# Patient Record
Sex: Female | Born: 1995 | Race: Black or African American | Hispanic: No | Marital: Single | Smoking: Never smoker
Health system: Southern US, Community
[De-identification: ages and names within clinical notes are randomized; demographics above are authoritative.]

## PROBLEM LIST (undated history)

## (undated) ENCOUNTER — Inpatient Hospital Stay (HOSPITAL_COMMUNITY): Payer: Self-pay

## (undated) DIAGNOSIS — J45909 Unspecified asthma, uncomplicated: Secondary | ICD-10-CM

## (undated) HISTORY — PX: NO PAST SURGERIES: SHX2092

---
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
## 2015-02-19 ENCOUNTER — Emergency Department (INDEPENDENT_AMBULATORY_CARE_PROVIDER_SITE_OTHER)
Admission: EM | Admit: 2015-02-19 | Discharge: 2015-02-19 | Disposition: A | Discharge status: Home / Self Care | Payer: No Typology Code available for payment source | Source: Home / Self Care | Attending: Emergency Medicine | Admitting: Emergency Medicine

## 2015-02-19 ENCOUNTER — Encounter (HOSPITAL_COMMUNITY): Payer: Self-pay | Admitting: Emergency Medicine

## 2015-02-19 DIAGNOSIS — S90221A Contusion of right lesser toe(s) with damage to nail, initial encounter: Secondary | ICD-10-CM | POA: Diagnosis not present

## 2015-02-19 HISTORY — DX: Unspecified asthma, uncomplicated: J45.909

## 2015-02-19 NOTE — Discharge Instructions (Signed)
It looks like you had a bruise under your nail that got infected. The infection is gone at this time. Please do Epsom salt soaks 3 times a day to prevent recurrent infection. It will likely take 4-5 months for your toenail to go back to normal. If it becomes red and tender, please come back.

## 2015-02-19 NOTE — ED Provider Notes (Signed)
CSN: [id number]     Arrival date & time 02/19/15  1636 History   First MD Initiated Contact with Patient 02/19/15 1854     Chief Complaint  Patient presents with  . Nail Problem   (Consider location/radiation/quality/duration/timing/severity/associated sxs/prior Treatment) HPI  She is an 19 year old woman here with her mother for evaluation of right toenail problem. She states she hit it against a wall about a month ago. Initially it was quite tender. She does report bruising underneath the nail. She states the pain has since resolved. At one point it did drain some pus.  Past Medical History  Diagnosis Date  . Asthma    History reviewed. No pertinent past surgical history. No family history on file. History  Substance Use Topics  . Smoking status: Never Smoker   . Smokeless tobacco: Not on file  . Alcohol Use: No   OB History    No data available     Review of Systems As in history of present illness Allergies  Review of patient's allergies indicates no known allergies.  Home Medications   Prior to Admission medications   Not on File   BP 113/71 mmHg  Pulse 75  Temp(Src) 97.6 F (36.4 C) (Oral)  Resp 18  SpO2 100%  LMP 02/17/2015 Physical Exam  Constitutional: She is oriented to person, place, and time. She appears well-developed and well-nourished. No distress.  Cardiovascular: Normal rate.   Pulmonary/Chest: Effort normal.  Neurological: She is alert and oriented to person, place, and time.  Skin:  Right great toe: No erythema or tenderness. She does have a ruptured blister at the distal lateral nail edge. The lateral toenail is loose. No drainage expressed. I'm unable to visualize the nail due to nail polish.    ED Course  Procedures (including critical care time) Labs Review Labs Reviewed - No data to display  Imaging Review No results found.   MDM   1. Contusion of toenail, right, initial encounter    Symptomatic treatment with Epsom salts  soaks. No sign of infection at this time. Return precautions reviewed.    Charm Rings, MD 02/19/15 Ernestina [place]

## 2015-02-19 NOTE — ED Notes (Signed)
Reports nail issues involving both great toes.  Left toe is improving.  Right great toe is worse than left great toe.  Denies pain currently, but toe has hurt.  Patient does get professional pedicures

## 2015-07-19 NOTE — L&D Delivery Note (Addendum)
[name], Erica S Female, 19 y.o., 28-Apr-1996  Operative Delivery Note At 3:35 PM a viable female, "Saundra ShellingZaiden" was delivered via Vaginal, Spontaneous Delivery.  Presentation: vertex; Position: Occiput, anterior with subsequent head rotation to the maternal left, shoulder dystocia or babie's right shoulder.     Delivery of the head: 04/10/2016  3:35 PM First maneuver: 04/10/2016  3:35 PM, Mc[place]ts Second maneuver: 04/10/2016  3:35 PM, Suprapubic Pressure Delivery of baby within a few seoonds of shoulder dystocia being called, suprapubic pressure applied twice to effect delivery.   APGAR: 7, 9; weight 8 lb 9.9 oz (3910 g).   Placenta status: normal appearing, intact, to L & D.   Cord: normal.   Anesthesia:   Episiotomy: None Lacerations: 1st degree;Perineal Suture Repair: 3.0 vicryl Est. Blood Loss (mL): 500 Cytotec 1000 mcg placed per rectum for postpartum hemorrhage with improvement in bleeding.    Mom to postpartum.  Baby to Couplet care / Skin to Skin.  Baby for outpatient circumcision.   Konrad FelixKULWA,Manu Rubey WAKURU, MD.  04/10/2016, 5:24 PM

## 2015-08-18 ENCOUNTER — Emergency Department (HOSPITAL_COMMUNITY)
Admission: EM | Admit: 2015-08-18 | Discharge: 2015-08-18 | Disposition: A | Discharge status: Home / Self Care | Payer: Medicaid Other | Attending: Physician Assistant | Admitting: Physician Assistant

## 2015-08-18 ENCOUNTER — Encounter (HOSPITAL_COMMUNITY): Payer: Self-pay | Admitting: Emergency Medicine

## 2015-08-18 ENCOUNTER — Emergency Department (HOSPITAL_COMMUNITY): Payer: Medicaid Other

## 2015-08-18 DIAGNOSIS — O2341 Unspecified infection of urinary tract in pregnancy, first trimester: Secondary | ICD-10-CM | POA: Diagnosis not present

## 2015-08-18 DIAGNOSIS — O99511 Diseases of the respiratory system complicating pregnancy, first trimester: Secondary | ICD-10-CM | POA: Diagnosis not present

## 2015-08-18 DIAGNOSIS — Z79899 Other long term (current) drug therapy: Secondary | ICD-10-CM | POA: Insufficient documentation

## 2015-08-18 DIAGNOSIS — Z3A01 Less than 8 weeks gestation of pregnancy: Secondary | ICD-10-CM | POA: Diagnosis not present

## 2015-08-18 DIAGNOSIS — J45909 Unspecified asthma, uncomplicated: Secondary | ICD-10-CM | POA: Insufficient documentation

## 2015-08-18 DIAGNOSIS — N390 Urinary tract infection, site not specified: Secondary | ICD-10-CM

## 2015-08-18 DIAGNOSIS — Z3490 Encounter for supervision of normal pregnancy, unspecified, unspecified trimester: Secondary | ICD-10-CM

## 2015-08-18 DIAGNOSIS — R52 Pain, unspecified: Secondary | ICD-10-CM

## 2015-08-18 DIAGNOSIS — O9989 Other specified diseases and conditions complicating pregnancy, childbirth and the puerperium: Secondary | ICD-10-CM | POA: Diagnosis present

## 2015-08-18 LAB — URINALYSIS, ROUTINE W REFLEX MICROSCOPIC
BILIRUBIN URINE: NEGATIVE
Glucose, UA: NEGATIVE mg/dL
Hgb urine dipstick: NEGATIVE
KETONES UR: NEGATIVE mg/dL
NITRITE: POSITIVE — AB
Protein, ur: NEGATIVE mg/dL
Specific Gravity, Urine: 1.027 (ref 1.005–1.030)
pH: 6.5 (ref 5.0–8.0)

## 2015-08-18 LAB — CBC
HCT: 38.7 % (ref 36.0–46.0)
Hemoglobin: 12.9 g/dL (ref 12.0–15.0)
MCH: 28.8 pg (ref 26.0–34.0)
MCHC: 33.3 g/dL (ref 30.0–36.0)
MCV: 86.4 fL (ref 78.0–100.0)
PLATELETS: 202 10*3/uL (ref 150–400)
RBC: 4.48 MIL/uL (ref 3.87–5.11)
RDW: 13.3 % (ref 11.5–15.5)
WBC: 5 10*3/uL (ref 4.0–10.5)

## 2015-08-18 LAB — HCG, QUANTITATIVE, PREGNANCY: hCG, Beta Chain, Quant, S: 43832 m[IU]/mL — ABNORMAL HIGH (ref <5)

## 2015-08-18 LAB — URINE MICROSCOPIC-ADD ON

## 2015-08-18 LAB — COMPREHENSIVE METABOLIC PANEL
ALBUMIN: 4 g/dL (ref 3.5–5.0)
ALK PHOS: 45 U/L (ref 38–126)
ALT: 14 U/L (ref 14–54)
ANION GAP: 10 (ref 5–15)
AST: 16 U/L (ref 15–41)
BILIRUBIN TOTAL: 0.9 mg/dL (ref 0.3–1.2)
BUN: 9 mg/dL (ref 6–20)
CALCIUM: 9.3 mg/dL (ref 8.9–10.3)
CO2: 25 mmol/L (ref 22–32)
Chloride: 102 mmol/L (ref 101–111)
Creatinine, Ser: 0.75 mg/dL (ref 0.44–1.00)
GFR calc Af Amer: >60 mL/min (ref >60)
GFR calc non Af Amer: >60 mL/min (ref >60)
GLUCOSE: 106 mg/dL — AB (ref 65–99)
Potassium: 3.4 mmol/L — ABNORMAL LOW (ref 3.5–5.1)
Sodium: 137 mmol/L (ref 135–145)
TOTAL PROTEIN: 7.7 g/dL (ref 6.5–8.1)

## 2015-08-18 LAB — WET PREP, GENITAL
Sperm: NONE SEEN
Trich, Wet Prep: NONE SEEN
Yeast Wet Prep HPF POC: NONE SEEN

## 2015-08-18 LAB — I-STAT BETA HCG BLOOD, ED (MC, WL, AP ONLY)

## 2015-08-18 MED ORDER — NITROFURANTOIN MONOHYD MACRO 100 MG PO CAPS
100.0000 mg | ORAL_CAPSULE | Freq: Once | ORAL | Status: AC
  Administered 2015-08-18: 100 mg via ORAL
  Filled 2015-08-18: qty 1

## 2015-08-18 MED ORDER — NITROFURANTOIN MONOHYD MACRO 100 MG PO CAPS: 100.0000 mg | ORAL_CAPSULE | Freq: Two times a day (BID) | ORAL | Status: DC

## 2015-08-18 NOTE — Progress Notes (Signed)
Entered in d/c instructions Associates, [facility] Schedule an appointment as soon as possible for a visit pcp per [facility] access is [facility] [address] [place], [place] [address] [phone]   Associates, [facility] This is your assigned Medicaid [place] access doctor If you prefer another contact DSS [phone] DSS assigned your doctor *You may receive a bill if you go to any family Dr not assigned to you   Medicaid [place] Access Covered Patient [place]: [phone] [address] [place], [place] [address] [email or web address] USE THIS WEBSITE TO ASSIST WITH UNDERSTANDING YOUR COVERAGE, RENEW APPLICATION As a Medicaid client you MUST contact DSS/SSI each time you change address, move to another [place] or another state to keep your address updated [place] Medicaid Transportation to Dr appts if you are have full Medicaid: [phone], [phone]

## 2015-08-18 NOTE — ED Notes (Signed)
Pt transported to US

## 2015-08-18 NOTE — ED Notes (Signed)
Patient has 2 extra GOLD tubes in main lab.

## 2015-08-18 NOTE — Progress Notes (Signed)
Pt medicaid [place] access response hx lists her pcp as [facility] [address] [place], [place] [address] [phone] Entered in [place]

## 2015-08-18 NOTE — ED Notes (Signed)
Patient transported to Ultrasound 

## 2015-08-18 NOTE — ED Provider Notes (Addendum)
CSN: [id number]     Arrival date & time 08/18/15  4098 History   First MD Initiated Contact with Patient 08/18/15 1051     Chief Complaint  Patient presents with  . Abdominal Pain     (Consider location/radiation/quality/duration/timing/severity/associated sxs/prior Treatment) HPI   Patient's very pleasant 20 year old female presenting with abdominal pain and back pain. She took positive pregnancy test at home 3. Patient here concerned about pregnancy. Patient denies any vaginal bleeding. Does endorse increased vaginal secretions.  Patient is G0 P1. This is an unintended pregnancy.  Past Medical History  Diagnosis Date  . Asthma    History reviewed. No pertinent past surgical history. No family history on file. Social History  Substance Use Topics  . Smoking status: Never Smoker   . Smokeless tobacco: None  . Alcohol Use: No   OB History    Gravida Para Term Preterm AB TAB SAB Ectopic Multiple Living   1              Review of Systems  Constitutional: Negative for activity change.  Respiratory: Negative for shortness of breath.   Cardiovascular: Negative for chest pain.  Gastrointestinal: Positive for abdominal pain.  Genitourinary: Positive for vaginal discharge. Negative for dysuria, vaginal bleeding and genital sores.  Musculoskeletal: Positive for back pain.  Psychiatric/Behavioral: Negative for agitation.  All other systems reviewed and are negative.     Allergies  Review of patient's allergies indicates no known allergies.  Home Medications   Prior to Admission medications   Medication Sig Start Date End Date Taking? Authorizing Provider  albuterol (PROVENTIL HFA;VENTOLIN HFA) 108 (90 Base) MCG/ACT inhaler Inhale 2 puffs into the lungs every 6 (six) hours as needed for wheezing or shortness of breath.   Yes Historical Provider, MD  nitrofurantoin, macrocrystal-monohydrate, (MACROBID) 100 MG capsule Take 1 capsule (100 mg total) by mouth 2 (two) times  daily. 08/18/15   Courteney Lyn Mackuen, MD   BP 112/70 mmHg  Pulse 76  Temp(Src) 97.8 F (36.6 C) (Oral)  Resp 14  SpO2 100%  LMP 06/17/2015 Physical Exam  Constitutional: She is oriented to person, place, and time. She appears well-developed and well-nourished.  HENT:  Head: Normocephalic and atraumatic.  Eyes: Conjunctivae are normal. Right eye exhibits no discharge.  Neck: Neck supple.  Cardiovascular: Normal rate, regular rhythm and normal heart sounds.   No murmur heard. Pulmonary/Chest: Effort normal and breath sounds normal. She has no wheezes. She has no rales.  Abdominal: Soft. She exhibits no distension. There is no tenderness.  Genitourinary:  No CMT. Normal vaginal mucosa. Increased vaginal discharge, white.  Musculoskeletal: Normal range of motion. She exhibits no edema.  Neurological: She is oriented to person, place, and time. No cranial nerve deficit.  Skin: Skin is warm and dry. No rash noted. She is not diaphoretic.  Psychiatric: She has a normal mood and affect. Her behavior is normal.  Nursing note and vitals reviewed.   ED Course  Procedures (including critical care time) Labs Review Labs Reviewed  WET PREP, GENITAL - Abnormal; Notable for the following:    Clue Cells Wet Prep HPF POC PRESENT (*)    WBC, Wet Prep HPF POC FEW (*)    All other components within normal limits  COMPREHENSIVE METABOLIC PANEL - Abnormal; Notable for the following:    Potassium 3.4 (*)    Glucose, Bld 106 (*)    All other components within normal limits  URINALYSIS, ROUTINE W REFLEX MICROSCOPIC (NOT AT [facility]) -  Abnormal; Notable for the following:    APPearance CLOUDY (*)    Nitrite POSITIVE (*)    Leukocytes, UA SMALL (*)    All other components within normal limits  HCG, QUANTITATIVE, PREGNANCY - Abnormal; Notable for the following:    hCG, Beta Chain, Quant, [place] [address] (*)    All other components within normal limits  URINE MICROSCOPIC-ADD ON - Abnormal; Notable for the  following:    Squamous Epithelial / LPF 0-5 (*)    Bacteria, UA MANY (*)    All other components within normal limits  I-STAT BETA HCG BLOOD, ED ([facility], [facility], [facility] ONLY) - Abnormal; Notable for the following:    I-stat hCG, quantitative >2000.0 (*)    All other components within normal limits  CBC  HIV ANTIBODY (ROUTINE TESTING)  RPR  GC/CHLAMYDIA PROBE AMP ([place]) NOT AT [facility]    Imaging Review US Ob Transvaginal  08/18/2015  CLINICAL DATA:  Pelvic pain for 11 days EXAM: OBSTETRIC <14 WK [place] AND TRANSVAGINAL OB US TECHNIQUE: Both transabdominal and transvaginal ultrasound examinations were performed for complete evaluation of the gestation as well as the maternal uterus, adnexal regions, and pelvic cul-de-sac. Transvaginal technique was performed to assess early pregnancy. COMPARISON:  None. FINDINGS: Intrauterine gestational sac: Visualized/normal in shape. Yolk sac:  Visualized Embryo:  Visualized Cardiac Activity: Visualize Heart Rate: 121  bpm MSD:   mm    w     d CRL:  9  mm   6 w   6 d                  [place] EDC: 04/06/2016 Subchorionic hemorrhage:  Small subchorionic hemorrhage. Maternal uterus/adnexae: No adnexal masses. Trace free fluid in the pelvis. IMPRESSION: Six week 6 day intrauterine pregnancy. Fetal heart rate 121 beats per minute. Small subchorionic hemorrhage. Electronically Signed   By: Charlett Nose M.D.   On: 08/18/2015 14:51   US Pelvis Complete  08/18/2015  CLINICAL DATA:  Pelvic pain for 11 days EXAM: OBSTETRIC <14 WK [place] AND TRANSVAGINAL OB US TECHNIQUE: Both transabdominal and transvaginal ultrasound examinations were performed for complete evaluation of the gestation as well as the maternal uterus, adnexal regions, and pelvic cul-de-sac. Transvaginal technique was performed to assess early pregnancy. COMPARISON:  None. FINDINGS: Intrauterine gestational sac: Visualized/normal in shape. Yolk sac:  Visualized Embryo:  Visualized Cardiac Activity: Visualize Heart Rate: 121  bpm  MSD:   mm    w     d CRL:  9  mm   6 w   6 d                  [place] EDC: 04/06/2016 Subchorionic hemorrhage:  Small subchorionic hemorrhage. Maternal uterus/adnexae: No adnexal masses. Trace free fluid in the pelvis. IMPRESSION: Six week 6 day intrauterine pregnancy. Fetal heart rate 121 beats per minute. Small subchorionic hemorrhage. Electronically Signed   By: Charlett Nose M.D.   On: 08/18/2015 14:51   I have personally reviewed and evaluated these images and lab results as part of my medical decision-making.   EKG Interpretation None      MDM   Final diagnoses:  Pregnancy  UTI (lower urinary tract infection)    Patient is very pleasant G0 P1 presenting with new pregnancy. Patient has had some abdominal pain and some back pain. Given the pain and new pregnancy we will do TV ultrasound to locate pregnancy. We had long discussion with patient about options including [facility].  We'll do vaginal exam to make sure the patient has no frank infection.  No bleeding so no need rhogam this time.  3:33 PM US shows 6.6 week fetus. Also UTI present. Will treat for UTI and patietn given a lot of follow up options.   Courteney Randall An, MD 08/18/15 1533  Courteney Randall An, MD 08/18/15 1533

## 2015-08-18 NOTE — Discharge Instructions (Signed)
You were seen today with back pain. We found that you have a new pregnancy. Also have a urinary tract infection. We discussed options for this pregnancy. Please follow-up with the primary care physician or [facility] with the following information.  [facility] Address: [address], [place], [place] [address] [phone]

## 2015-08-18 NOTE — ED Notes (Addendum)
Per pt, states abdominal and back pain since Saturday-no dysuria-states she took 3 pregnancy tests and hey were all postive

## 2015-08-19 LAB — GC/CHLAMYDIA PROBE AMP (~~LOC~~) NOT AT ARMC
Chlamydia: NEGATIVE
Neisseria Gonorrhea: NEGATIVE

## 2015-08-19 LAB — HIV ANTIBODY (ROUTINE TESTING W REFLEX): HIV Screen 4th Generation wRfx: NONREACTIVE

## 2015-08-19 LAB — RPR: RPR Ser Ql: NONREACTIVE

## 2015-09-24 LAB — OB RESULTS CONSOLE GC/CHLAMYDIA: GC PROBE AMP, GENITAL: NEGATIVE

## 2015-09-24 LAB — OB RESULTS CONSOLE HEPATITIS B SURFACE ANTIGEN: HEP B S AG: NEGATIVE

## 2015-10-16 ENCOUNTER — Encounter (HOSPITAL_COMMUNITY): Payer: Self-pay | Admitting: *Deleted

## 2015-10-16 ENCOUNTER — Inpatient Hospital Stay (HOSPITAL_COMMUNITY)
Admission: AD | Admit: 2015-10-16 | Discharge: 2015-10-16 | Disposition: A | Discharge status: Home / Self Care | Payer: Medicaid Other | Source: Ambulatory Visit | Attending: Obstetrics and Gynecology | Admitting: Obstetrics and Gynecology

## 2015-10-16 DIAGNOSIS — O99512 Diseases of the respiratory system complicating pregnancy, second trimester: Secondary | ICD-10-CM | POA: Diagnosis not present

## 2015-10-16 DIAGNOSIS — O26892 Other specified pregnancy related conditions, second trimester: Secondary | ICD-10-CM | POA: Insufficient documentation

## 2015-10-16 DIAGNOSIS — R1031 Right lower quadrant pain: Secondary | ICD-10-CM | POA: Diagnosis present

## 2015-10-16 DIAGNOSIS — Z3A15 15 weeks gestation of pregnancy: Secondary | ICD-10-CM | POA: Diagnosis not present

## 2015-10-16 DIAGNOSIS — O26852 Spotting complicating pregnancy, second trimester: Secondary | ICD-10-CM | POA: Diagnosis present

## 2015-10-16 DIAGNOSIS — J45909 Unspecified asthma, uncomplicated: Secondary | ICD-10-CM | POA: Diagnosis not present

## 2015-10-16 LAB — URINALYSIS, ROUTINE W REFLEX MICROSCOPIC
Bilirubin Urine: NEGATIVE
Glucose, UA: NEGATIVE mg/dL
HGB URINE DIPSTICK: NEGATIVE
KETONES UR: 15 mg/dL — AB
Nitrite: NEGATIVE
PROTEIN: NEGATIVE mg/dL
Specific Gravity, Urine: 1.02 (ref 1.005–1.030)
pH: 6.5 (ref 5.0–8.0)

## 2015-10-16 LAB — URINE MICROSCOPIC-ADD ON: RBC / HPF: NONE SEEN RBC/hpf (ref 0–5)

## 2015-10-16 MED ORDER — IBUPROFEN 100 MG/5ML PO SUSP: 600.0000 mg | Freq: Four times a day (QID) | ORAL | Status: DC | PRN

## 2015-10-16 MED ORDER — IBUPROFEN 100 MG/5ML PO SUSP
600.0000 mg | ORAL | Status: AC
  Administered 2015-10-16: 600 mg via ORAL
  Filled 2015-10-16: qty 30

## 2015-10-16 NOTE — MAU Provider Note (Signed)
  History   20 yo G1P0 at 2315 2/7 weeks presented unannounced c/o right lower abdominal pain into groin, extending into upper thigh.  Denies leaking, bleeding, dysuria, fever, trauma, or any other issues. Reports pain in sharp, more present when lying completely flat or turning.  Seen in office this week for spotting, had been dx with BV previously, but was not taking medication correctly.    Patient Active Problem List   Diagnosis Date Noted  . Asthma 10/16/2015  . Spotting affecting pregnancy in second trimester 10/16/2015    Chief Complaint  Patient presents with  . Abdominal Cramping   HPI: As above  OB History    Gravida Para Term Preterm AB TAB SAB Ectopic Multiple Living   1               Past Medical History  Diagnosis Date  . Asthma     No past surgical history on file.  No family history on file.  Social History  Substance Use Topics  . Smoking status: Never Smoker   . Smokeless tobacco: Not on file  . Alcohol Use: No    Allergies: No Known Allergies  No prescriptions prior to admission    ROS:  RLQ/groin pain Physical Exam   Blood pressure 114/59, pulse 91, temperature 98.3 F (36.8 C), temperature source Oral, resp. rate 16, height 5' 2.99" (1.6 m), weight 77.111 kg (170 lb), last menstrual period 06/17/2015, SpO2 100 %.    Physical Exam  In NAD Chest clear Heart RRR without murmur Abd gravid, NT, fundal height 16 weeks, no rebound or guarding. Pelvic--no d/c, cervix closed, uterus NT Ext WNL  FHR 154  Results for orders placed or performed during the hospital encounter of 10/16/15 (from the past 24 hour(s))  Urinalysis, Routine w reflex microscopic (not at [facility])     Status: Abnormal   Collection Time: 10/16/15 11:45 AM  Result Value Ref Range   Color, Urine YELLOW YELLOW   APPearance HAZY (A) CLEAR   Specific Gravity, Urine 1.020 1.005 - 1.030   pH 6.5 5.0 - 8.0   Glucose, UA NEGATIVE NEGATIVE mg/dL   Hgb urine dipstick NEGATIVE  NEGATIVE   Bilirubin Urine NEGATIVE NEGATIVE   Ketones, ur 15 (A) NEGATIVE mg/dL   Protein, ur NEGATIVE NEGATIVE mg/dL   Nitrite NEGATIVE NEGATIVE   Leukocytes, UA TRACE (A) NEGATIVE  Urine microscopic-add on     Status: Abnormal   Collection Time: 10/16/15 11:45 AM  Result Value Ref Range   Squamous Epithelial / LPF 0-5 (A) NONE SEEN   WBC, UA 0-5 0 - 5 WBC/hpf   RBC / HPF NONE SEEN 0 - 5 RBC/hpf   Bacteria, UA RARE (A) NONE SEEN   Urine-Other MUCOUS PRESENT     ED Course  Assessment: IUP at 15 2/7 weeks Round ligament pain  Plan: D/C home Rx Ibuprophen 600 mg po q 6 hours x 24-48 hours, then prn (liquid form, due to patient preference). Comfort measures for round ligament pain. [facility] for anatomy US, call prn.  Nigel BridgemanLATHAM, Ladarrius Bogdanski CNM, MSN 10/16/2015 2:49 PM

## 2015-10-16 NOTE — Discharge Instructions (Signed)
TAKE IBUPROPHEN EVERY 6 HOURS FOR NEXT 2 DAYS, THEN AS NEEDED.  Round Ligament Pain The round ligament is a cord of muscle and tissue that helps to support the uterus. It can become a source of pain during pregnancy if it becomes stretched or twisted as the baby grows. The pain usually begins in the second trimester of pregnancy, and it can come and go until the baby is delivered. It is not a serious problem, and it does not cause harm to the baby. Round ligament pain is usually a short, sharp, and pinching pain, but it can also be a dull, lingering, and aching pain. The pain is felt in the lower side of the abdomen or in the groin. It usually starts deep in the groin and moves up to the outside of the hip area. Pain can occur with:  A sudden change in position.  Rolling over in bed.  Coughing or sneezing.  Physical activity. HOME CARE INSTRUCTIONS Watch your condition for any changes. Take these steps to help with your pain:  When the pain starts, relax. Then try:  Sitting down.  Flexing your knees up to your abdomen.  Lying on your side with one pillow under your abdomen and another pillow between your legs.  Sitting in a warm bath for 15-20 minutes or until the pain goes away.  Take over-the-counter and prescription medicines only as told by your health care provider.  Move slowly when you sit and stand.  Avoid long walks if they cause pain.  Stop or lessen your physical activities if they cause pain. SEEK MEDICAL CARE IF:  Your pain does not go away with treatment.  You feel pain in your back that you did not have before.  Your medicine is not helping. SEEK IMMEDIATE MEDICAL CARE IF:  You develop a fever or chills.  You develop uterine contractions.  You develop vaginal bleeding.  You develop nausea or vomiting.  You develop diarrhea.  You have pain when you urinate.   This information is not intended to replace advice given to you by your health care  provider. Make sure you discuss any questions you have with your health care provider.   Document Released: 04/12/2008 Document Revised: 09/26/2011 Document Reviewed: 09/10/2014 Elsevier Interactive Patient Education [employer]2016 Elsevier Inc.

## 2015-10-16 NOTE — MAU Note (Signed)
Pt presents to MAU with complaints of lower abdominal cramping that stated yesterday. Pt reports scant amount of vaginal bleeding when she wipes.

## 2016-02-12 ENCOUNTER — Inpatient Hospital Stay (HOSPITAL_COMMUNITY)
Admission: AD | Admit: 2016-02-12 | Discharge: 2016-02-12 | Disposition: A | Discharge status: Home / Self Care | Payer: Medicaid Other | Source: Ambulatory Visit | Attending: Obstetrics & Gynecology | Admitting: Obstetrics & Gynecology

## 2016-02-12 ENCOUNTER — Encounter (HOSPITAL_COMMUNITY): Payer: Self-pay | Admitting: *Deleted

## 2016-02-12 DIAGNOSIS — O99513 Diseases of the respiratory system complicating pregnancy, third trimester: Secondary | ICD-10-CM | POA: Insufficient documentation

## 2016-02-12 DIAGNOSIS — O26893 Other specified pregnancy related conditions, third trimester: Secondary | ICD-10-CM | POA: Diagnosis present

## 2016-02-12 DIAGNOSIS — R109 Unspecified abdominal pain: Secondary | ICD-10-CM | POA: Diagnosis present

## 2016-02-12 DIAGNOSIS — Z3A32 32 weeks gestation of pregnancy: Secondary | ICD-10-CM | POA: Insufficient documentation

## 2016-02-12 DIAGNOSIS — N949 Unspecified condition associated with female genital organs and menstrual cycle: Secondary | ICD-10-CM | POA: Diagnosis not present

## 2016-02-12 DIAGNOSIS — O9989 Other specified diseases and conditions complicating pregnancy, childbirth and the puerperium: Secondary | ICD-10-CM

## 2016-02-12 DIAGNOSIS — J45909 Unspecified asthma, uncomplicated: Secondary | ICD-10-CM | POA: Insufficient documentation

## 2016-02-12 LAB — URINE MICROSCOPIC-ADD ON

## 2016-02-12 LAB — URINALYSIS, ROUTINE W REFLEX MICROSCOPIC
Bilirubin Urine: NEGATIVE
GLUCOSE, UA: NEGATIVE mg/dL
HGB URINE DIPSTICK: NEGATIVE
Ketones, ur: NEGATIVE mg/dL
Nitrite: NEGATIVE
PH: 8 (ref 5.0–8.0)
PROTEIN: NEGATIVE mg/dL
Specific Gravity, Urine: 1.02 (ref 1.005–1.030)

## 2016-02-12 NOTE — MAU Provider Note (Signed)
History     CSN: [id number]  Arrival date and time: 02/12/16 2125   First Provider Initiated Contact with Patient 02/12/16 2215      Chief Complaint  Patient presents with  . Abdominal Cramping   HPI Ms. Erica Barber is a 20 y.o. G1P0 at [redacted]w[redacted]d who presents to MAU today with complaint of lower abdominal cramping. The patient states that pain started around 1300 today. She initially states pain has been constant, but then states no pain currently as it is only with movement. She states pain resolves while at rest. She describes pain as cramping in the lower to mid abdomen. She denies vaginal bleeding, LOF, UTI symptoms, contractions or recent N/V/D. She states no known complications with this pregnancy and reports good fetal movement.   OB History    Gravida Para Term Preterm AB Living   1             SAB TAB Ectopic Multiple Live Births                  Past Medical History:  Diagnosis Date  . Asthma     History reviewed. No pertinent surgical history.  No family history on file.  Social History  Substance Use Topics  . Smoking status: Never Smoker  . Smokeless tobacco: Not on file  . Alcohol use No    Allergies: No Known Allergies  Prescriptions Prior to Admission  Medication Sig Dispense Refill Last Dose  . Prenatal Vit-Fe Fumarate-FA (PRENATAL MULTIVITAMIN) TABS tablet Take 3 tablets by mouth at bedtime.    02/11/2016 at Unknown time  . ibuprofen (ADVIL,MOTRIN) 100 MG/5ML suspension Take 30 mLs (600 mg total) by mouth every 6 (six) hours as needed. (Patient not taking: Reported on 02/12/2016) 600 mL 1 Not Taking at Unknown time    Review of Systems  Constitutional: Negative for fever and malaise/fatigue.  Gastrointestinal: Positive for abdominal pain. Negative for constipation, diarrhea, nausea and vomiting.  Genitourinary: Negative for dysuria, frequency and urgency.       Neg - vaginal bleeding, discharge, LOF   Physical Exam   Blood pressure 116/66,  pulse 96, temperature 98.7 F (37.1 C), resp. rate 18, height 5' 2.5" (1.588 m), weight 191 lb 9.6 oz (86.9 kg), last menstrual period 06/17/2015.  Physical Exam  Nursing note and vitals reviewed. Constitutional: She is oriented to person, place, and time. She appears well-developed and well-nourished. No distress.  HENT:  Head: Normocephalic and atraumatic.  Cardiovascular: Normal rate.   Respiratory: Effort normal.  GI: Soft. She exhibits no distension and no mass. There is no tenderness. There is no rebound and no guarding.  Neurological: She is alert and oriented to person, place, and time.  Skin: Skin is warm and dry. No erythema.  Psychiatric: She has a normal mood and affect.   Dilation: Closed Effacement (%): Thick Cervical Position: Posterior Exam by:: Harlon Flor  Results for orders placed or performed during the hospital encounter of 02/12/16 (from the past 24 hour(s))  Urinalysis, Routine w reflex microscopic (not at [facility])     Status: Abnormal   Collection Time: 02/12/16  9:47 PM  Result Value Ref Range   Color, Urine YELLOW YELLOW   APPearance CLEAR CLEAR   Specific Gravity, Urine 1.020 1.005 - 1.030   pH 8.0 5.0 - 8.0   Glucose, UA NEGATIVE NEGATIVE mg/dL   Hgb urine dipstick NEGATIVE NEGATIVE   Bilirubin Urine NEGATIVE NEGATIVE   Ketones, ur NEGATIVE NEGATIVE mg/dL  Protein, ur NEGATIVE NEGATIVE mg/dL   Nitrite NEGATIVE NEGATIVE   Leukocytes, UA SMALL (A) NEGATIVE  Urine microscopic-add on     Status: Abnormal   Collection Time: 02/12/16  9:47 PM  Result Value Ref Range   Squamous Epithelial / LPF 6-30 (A) NONE SEEN   WBC, UA 0-5 0 - 5 WBC/hpf   RBC / HPF 0-5 0 - 5 RBC/hpf   Bacteria, UA FEW (A) NONE SEEN    MAU Course  Procedures None  MDM UA today without evidence of dehydration or infection FFN collected prior to SVE. Cervix is closed, FFN discarded.  Discussed patient with Dr. [name], agrees with plan for discharge at this time with preterm labor  precautions.  Assessment and Plan  A: SIUP at [redacted]w[redacted]d Round ligament pain  P: Discharge home Tylenol PRN for pain  Preterm labor precautions discussed Patient advised to increased PO hydration as tolerated Discussed use of abdominal binder, warm bath/shower and moderation of activity for pain Patient advised to follow-up with [facility] as scheduled for routine prenatal care or sooner PRN Patient may return to MAU as needed or if her condition were to change or worsen   Marny Lowenstein, PA-C  02/12/2016, 10:15 PM

## 2016-02-12 NOTE — MAU Note (Signed)
Pt reports abdominal cramping which stated around 1pm today. She noticed the cramping when she was trying to sit up and when she started to walk. Denies vaginal bleeding or discharge.

## 2016-02-12 NOTE — Discharge Instructions (Signed)

## 2016-02-12 NOTE — MAU Note (Signed)
Cramping on R side of lower abd today. Worse when I would try to get up. Does not hurt lying down. Denies LOF or bleeding.

## 2016-03-08 LAB — OB RESULTS CONSOLE GBS: STREP GROUP B AG: POSITIVE

## 2016-04-09 ENCOUNTER — Inpatient Hospital Stay (HOSPITAL_COMMUNITY)
Admission: AD | Admit: 2016-04-09 | Discharge: 2016-04-12 | DRG: 774 | Disposition: A | Discharge status: Home / Self Care | Payer: Medicaid Other | Source: Ambulatory Visit | Attending: Obstetrics & Gynecology | Admitting: Obstetrics & Gynecology

## 2016-04-09 ENCOUNTER — Encounter (HOSPITAL_COMMUNITY): Payer: Self-pay | Admitting: *Deleted

## 2016-04-09 DIAGNOSIS — O9902 Anemia complicating childbirth: Secondary | ICD-10-CM | POA: Diagnosis present

## 2016-04-09 DIAGNOSIS — B951 Streptococcus, group B, as the cause of diseases classified elsewhere: Secondary | ICD-10-CM

## 2016-04-09 DIAGNOSIS — Z3A40 40 weeks gestation of pregnancy: Secondary | ICD-10-CM | POA: Diagnosis not present

## 2016-04-09 DIAGNOSIS — O4202 Full-term premature rupture of membranes, onset of labor within 24 hours of rupture: Principal | ICD-10-CM | POA: Diagnosis present

## 2016-04-09 DIAGNOSIS — O99824 Streptococcus B carrier state complicating childbirth: Secondary | ICD-10-CM | POA: Diagnosis present

## 2016-04-09 DIAGNOSIS — D649 Anemia, unspecified: Secondary | ICD-10-CM | POA: Diagnosis present

## 2016-04-09 DIAGNOSIS — O9952 Diseases of the respiratory system complicating childbirth: Secondary | ICD-10-CM | POA: Diagnosis present

## 2016-04-09 DIAGNOSIS — O4292 Full-term premature rupture of membranes, unspecified as to length of time between rupture and onset of labor: Secondary | ICD-10-CM

## 2016-04-09 DIAGNOSIS — IMO0001 Reserved for inherently not codable concepts without codable children: Secondary | ICD-10-CM

## 2016-04-09 DIAGNOSIS — J45909 Unspecified asthma, uncomplicated: Secondary | ICD-10-CM | POA: Diagnosis present

## 2016-04-09 LAB — URINE MICROSCOPIC-ADD ON

## 2016-04-09 LAB — RPR: RPR: NONREACTIVE

## 2016-04-09 LAB — CBC
HCT: 34.1 % — ABNORMAL LOW (ref 36.0–46.0)
HEMOGLOBIN: 11.4 g/dL — AB (ref 12.0–15.0)
MCH: 28.3 pg (ref 26.0–34.0)
MCHC: 33.4 g/dL (ref 30.0–36.0)
MCV: 84.6 fL (ref 78.0–100.0)
PLATELETS: 179 10*3/uL (ref 150–400)
RBC: 4.03 MIL/uL (ref 3.87–5.11)
RDW: 15 % (ref 11.5–15.5)
WBC: 8.1 10*3/uL (ref 4.0–10.5)

## 2016-04-09 LAB — URINALYSIS, ROUTINE W REFLEX MICROSCOPIC
BILIRUBIN URINE: NEGATIVE
Glucose, UA: NEGATIVE mg/dL
Ketones, ur: NEGATIVE mg/dL
Nitrite: NEGATIVE
PH: 7 (ref 5.0–8.0)
Protein, ur: NEGATIVE mg/dL
SPECIFIC GRAVITY, URINE: 1.01 (ref 1.005–1.030)

## 2016-04-09 LAB — TYPE AND SCREEN
ABO/RH(D): O POS
ANTIBODY SCREEN: NEGATIVE

## 2016-04-09 LAB — ABO/RH: ABO/RH(D): O POS

## 2016-04-09 LAB — POCT FERN TEST: POCT FERN TEST: POSITIVE

## 2016-04-09 MED ORDER — PENICILLIN G POTASSIUM 5000000 UNITS IJ SOLR
2.5000 10*6.[IU] | INTRAVENOUS | Status: DC
  Administered 2016-04-09 – 2016-04-10 (×6): 2.5 10*6.[IU] via INTRAVENOUS
  Filled 2016-04-09 (×13): qty 2.5

## 2016-04-09 MED ORDER — EPHEDRINE 5 MG/ML INJ
10.0000 mg | INTRAVENOUS | Status: DC | PRN
  Administered 2016-04-10: 15 mg via INTRAVENOUS
  Filled 2016-04-09: qty 4

## 2016-04-09 MED ORDER — LIDOCAINE HCL (PF) 1 % IJ SOLN
30.0000 mL | INTRAMUSCULAR | Status: DC | PRN
  Filled 2016-04-09: qty 30

## 2016-04-09 MED ORDER — FLEET ENEMA 7-19 GM/118ML RE ENEM: 1.0000 | ENEMA | RECTAL | Status: DC | PRN

## 2016-04-09 MED ORDER — ONDANSETRON HCL 4 MG/2ML IJ SOLN
4.0000 mg | Freq: Four times a day (QID) | INTRAMUSCULAR | Status: DC | PRN
  Administered 2016-04-09: 4 mg via INTRAVENOUS
  Filled 2016-04-09: qty 2

## 2016-04-09 MED ORDER — OXYCODONE-ACETAMINOPHEN 5-325 MG PO TABS: 1.0000 | ORAL_TABLET | ORAL | Status: DC | PRN

## 2016-04-09 MED ORDER — OXYTOCIN 40 UNITS IN LACTATED RINGERS INFUSION - SIMPLE MED: 2.5000 [IU]/h | INTRAVENOUS | Status: DC

## 2016-04-09 MED ORDER — OXYTOCIN 40 UNITS IN LACTATED RINGERS INFUSION - SIMPLE MED
1.0000 m[IU]/min | INTRAVENOUS | Status: DC
  Administered 2016-04-09: 1 m[IU]/min via INTRAVENOUS
  Filled 2016-04-09: qty 1000

## 2016-04-09 MED ORDER — PHENYLEPHRINE 40 MCG/ML (10ML) SYRINGE FOR IV PUSH (FOR BLOOD PRESSURE SUPPORT)
80.0000 ug | PREFILLED_SYRINGE | INTRAVENOUS | Status: DC | PRN
  Filled 2016-04-09: qty 5
  Filled 2016-04-09: qty 10

## 2016-04-09 MED ORDER — EPHEDRINE 5 MG/ML INJ
10.0000 mg | INTRAVENOUS | Status: DC | PRN
  Filled 2016-04-09 (×2): qty 4

## 2016-04-09 MED ORDER — TERBUTALINE SULFATE 1 MG/ML IJ SOLN
0.2500 mg | Freq: Once | INTRAMUSCULAR | Status: DC | PRN
  Filled 2016-04-09: qty 1

## 2016-04-09 MED ORDER — ACETAMINOPHEN 325 MG PO TABS: 650.0000 mg | ORAL_TABLET | ORAL | Status: DC | PRN

## 2016-04-09 MED ORDER — FENTANYL 2.5 MCG/ML BUPIVACAINE 1/10 % EPIDURAL INFUSION (WH - ANES)
14.0000 mL/h | INTRAMUSCULAR | Status: DC | PRN
  Administered 2016-04-10 (×2): 14 mL/h via EPIDURAL
  Filled 2016-04-09 (×2): qty 125

## 2016-04-09 MED ORDER — OXYCODONE-ACETAMINOPHEN 5-325 MG PO TABS: 2.0000 | ORAL_TABLET | ORAL | Status: DC | PRN

## 2016-04-09 MED ORDER — OXYTOCIN BOLUS FROM INFUSION: 500.0000 mL | Freq: Once | INTRAVENOUS | Status: DC

## 2016-04-09 MED ORDER — PHENYLEPHRINE 40 MCG/ML (10ML) SYRINGE FOR IV PUSH (FOR BLOOD PRESSURE SUPPORT)
80.0000 ug | PREFILLED_SYRINGE | INTRAVENOUS | Status: DC | PRN
  Administered 2016-04-10: 80 ug via INTRAVENOUS
  Filled 2016-04-09: qty 5

## 2016-04-09 MED ORDER — SOD CITRATE-CITRIC ACID 500-334 MG/5ML PO SOLN: 30.0000 mL | ORAL | Status: DC | PRN

## 2016-04-09 MED ORDER — LACTATED RINGERS IV SOLN
INTRAVENOUS | Status: DC
  Administered 2016-04-09 – 2016-04-10 (×3): via INTRAVENOUS

## 2016-04-09 MED ORDER — DIPHENHYDRAMINE HCL 50 MG/ML IJ SOLN: 12.5000 mg | INTRAMUSCULAR | Status: DC | PRN

## 2016-04-09 MED ORDER — LACTATED RINGERS IV SOLN: 500.0000 mL | INTRAVENOUS | Status: DC | PRN

## 2016-04-09 MED ORDER — LACTATED RINGERS IV SOLN: 500.0000 mL | Freq: Once | INTRAVENOUS | Status: DC

## 2016-04-09 MED ORDER — PENICILLIN G POTASSIUM 5000000 UNITS IJ SOLR
5.0000 10*6.[IU] | Freq: Once | INTRAVENOUS | Status: AC
  Administered 2016-04-09: 5 10*6.[IU] via INTRAVENOUS
  Filled 2016-04-09: qty 5

## 2016-04-09 MED ORDER — FENTANYL CITRATE (PF) 100 MCG/2ML IJ SOLN
50.0000 ug | INTRAMUSCULAR | Status: DC | PRN
  Administered 2016-04-09 (×4): 100 ug via INTRAVENOUS
  Filled 2016-04-09 (×4): qty 2

## 2016-04-09 NOTE — Progress Notes (Signed)
LABOR PROGRESS NOTE  Erica Barber is a 20 y.o. G1P0 at 3478w5d  admitted for PPROM @ 1300 04/09/16  Subjective: Ruptured for 32 hours; Afebrile  Objective: BP 131/71   Pulse 86   Temp 98.7 F (37.1 C)   Resp 18   Ht 5\' 2"  (1.575 m)   Wt 203 lb (92.1 kg)   LMP 06/17/2015   SpO2 100%   BMI 37.13 kg/m  or  Vitals:   04/09/16 1731 04/09/16 1919 04/09/16 2015 04/09/16 2048  BP: 115/70 136/78 119/82 131/71  Pulse: 81  79 86  Resp:      Temp:  98.7 F (37.1 C)    TempSrc:      SpO2:      Weight:      Height:         Dilation: 4.5 Effacement (%): 90 Station: -2 Presentation: Vertex Exam by:: Lawson FiscalLori, CNM  4-5/90/-1 Labs: Lab Results  Component Value Date   WBC 8.1 04/09/2016   HGB 11.4 (L) 04/09/2016   HCT 34.1 (L) 04/09/2016   MCV 84.6 04/09/2016   PLT 179 04/09/2016    Patient Active Problem List   Diagnosis Date Noted  . Active labor at term 04/09/2016  . Asthma 10/16/2015  . Spotting affecting pregnancy in second trimester 10/16/2015    Assessment / Plan:   Labor: pitocin Fetal Wellbeing:  Cat 1 Pain Control:  Iv pain medicine Anticipated MOD:  SVD  Lori A Clemmons 04/09/2016, 9:09 PM

## 2016-04-09 NOTE — Progress Notes (Signed)
Susette RacerShataura S Huffine is a 20 y.o. G1P0 at 6134w5d admitted for rupture of membranes  Subjective: Feels contractions q 3min all night per pt and her mother.  Reports leaking since 1p yesterday.  Wants to avoid epidural if possible.  Still leaking clear fluid.  Objective: BP 127/77   Pulse (!) 122   Temp 98.3 F (36.8 C) (Oral)   Resp 18   Ht 5\' 2"  (1.575 m)   Wt 203 lb (92.1 kg)   LMP 06/17/2015   SpO2 100%   BMI 37.13 kg/m  No intake/output data recorded. Total I/O In: 240 [P.O.:240] Out: 100 [Emesis/NG output:100]  FHT:  FHR: 130 bpm, variability: moderate,  accelerations:  Present,  decelerations:  Absent UC:   regular, every 3 minutes SVE:   Dilation: 1 Effacement (%): 50 Station: -2 Exam by:: Marcelino DusterMichelle, RN   Labs: Lab Results  Component Value Date   WBC 8.1 04/09/2016   HGB 11.4 (L) 04/09/2016   HCT 34.1 (L) 04/09/2016   MCV 84.6 04/09/2016   PLT 179 04/09/2016    Assessment / Plan: SROM not in active labor  Labor: Will augment with pitocin Preeclampsia:  no signs or symptoms of toxicity Fetal Wellbeing:  Category I Pain Control:  options reviewed currently desires no meds I/D:  GBS + on pcn Anticipated MOD:  NSVD  Elianna Windom Y 04/09/2016, 12:35 PM

## 2016-04-09 NOTE — Progress Notes (Signed)
Dr. Su Hiltoberts notified of a pt in MAU.  Notified that pt is a G1P0 at 3523w5d.  Notified that pt came in with complaints of contractions and leaking of fluid.  Notified that pt is fern positive and has pooling upon a speculum exam.  Notified that pt 1.5, 50, -3, and vertex.  Notified that pt is GBS positive.  Provider states to put in admission orders to Labor and Delivery and to put in penicillin orders to treat the GBS.

## 2016-04-09 NOTE — Progress Notes (Signed)
Erica Barber is a 20 y.o. G1P0 at 4081w5d admitted for PROM  Subjective: No complaints  Objective: BP 115/70   Pulse 81   Temp 98.7 F (37.1 C) (Oral)   Resp 18   Ht 5\' 2"  (1.575 m)   Wt 203 lb (92.1 kg)   LMP 06/17/2015   SpO2 100%   BMI 37.13 kg/m  No intake/output data recorded. Total I/O In: 240 [P.O.:240] Out: 100 [Emesis/NG output:100]  FHT:  FHR: 130 bpm, variability: moderate,  accelerations:  Present,  decelerations:  Absent UC:   regular, every q2-4 minutes SVE:   Dilation: 3.5 Effacement (%): 90 Station: -2 Exam by:: Dr Su Hiltoberts  AROM of forebag - mec stained IUPC placed  Labs: Lab Results  Component Value Date   WBC 8.1 04/09/2016   HGB 11.4 (L) 04/09/2016   HCT 34.1 (L) 04/09/2016   MCV 84.6 04/09/2016   PLT 179 04/09/2016    Assessment / Plan: Augmentation of labor, progressing well  Labor: progressing well, cont to titrate pitocin as indicated Preeclampsia:  no signs or symptoms of toxicity Fetal Wellbeing:  Category I Pain Control:  support currently with pain meds upon request I/D:  GBS + on PCN with no signs or sxs of infection Anticipated MOD:  NSVD  Makeshia Seat Y 04/09/2016, 5:32 PM

## 2016-04-09 NOTE — MAU Note (Signed)
Pt states that contractions started yesterday afternoon and they are between 5 and 10 minutes apart.  Pt states she started leaking around 1200 yesterday.  Pt states it was white fluid and then in the evening it became brown discharge and around 10 or 11 at night the fluid became pink tinged.  Pt states she has been using the bathroom a lot and it hurts when she pees.  Pt states she is feeling the baby move.

## 2016-04-09 NOTE — H&P (Signed)
Erica RacerShataura S Demmer is a 20 y.o. female presenting for rupture of membranes maybe since yesterday at 1pm.   OB History    Gravida Para Term Preterm AB Living   1             SAB TAB Ectopic Multiple Live Births                 Past Medical History:  Diagnosis Date  . Asthma    Past Surgical History:  Procedure Laterality Date  . NO PAST SURGERIES     Family History: family history is not on file. Social History:  reports that she has never smoked. She has never used smokeless tobacco. She reports that she does not drink alcohol or use drugs.     Maternal Diabetes: No Genetic Screening: Normal Maternal Ultrasounds/Referrals: Normal Fetal Ultrasounds or other Referrals:  None Maternal Substance Abuse:  No Significant Maternal Medications:  None Significant Maternal Lab Results:  Lab values include: Group B Strep positive Other Comments:  None  ROS  Non-contributory  History Dilation: 1 Effacement (%): 50 Station: -2 Exam by:: Marcelino DusterMichelle, RN  Blood pressure 127/77, pulse (!) 122, temperature 98.3 F (36.8 C), temperature source Oral, resp. rate 18, height 5\' 2"  (1.575 m), weight 203 lb (92.1 kg), last menstrual period 06/17/2015, SpO2 100 %. Exam Physical Exam  Lungs CTA  CV RRR ABD gravid, NT Ext no calf tenderness FHT cat 1 Toco q3-175min  Prenatal labs: ABO, Rh: --/--/O POS (09/23 16100835) Antibody: NEG (09/23 0835) Rubella:  Immune RPR: Non Reactive (01/31 0950)  HBsAg:   neg HIV: Non Reactive (01/31 0950)  GBS: Positive (08/22 0000)   Assessment/Plan: P0 at 40 5/7wks with SROM and contractions.  Will observe for now but start pitocin if not progressing.  Fetal status is reassuring.  PCN for GBS +.  Pain medicine upon request.   Purcell NailsROBERTS,Frankey Botting Y 04/09/2016, 1:02 PM

## 2016-04-09 NOTE — Anesthesia Pain Management Evaluation Note (Signed)
  CRNA Pain Management Visit Note  Patient: Erica Barber, 20 y.o., female  "Hello I am a [facility] the anesthesia [facility]. We have an anesthesia team available at all times to provide care throughout the hospital, including epidural management and anesthesia for C-section. I don't know your plan for the delivery whether it a natural birth, water birth, IV sedation, nitrous supplementation, doula or epidural, but we want to meet your pain goals."   1.Was your pain managed to your expectations on prior hospitalizations?   No prior hospitalizations  2.What is your expectation for pain management during this hospitalization?     Labor support without medications  3.How can we help you reach that goal? Be available if needed  Record the patient's initial score and the patient's pain goal.   Pain: 8  Pain Goal: 8 [facility] wants you to be able to say your pain was always managed very well.  Madison HickmanGREGORY,Hersh Minney 04/09/2016

## 2016-04-09 NOTE — MAU Provider Note (Signed)
History   [id number]   Chief Complaint  Patient presents with  . Contractions    HPI Erica Barber is a 20 y.o. female  G1P0 here with report of watery vaginal discharge that began at approximately noon yesterday. Leaking of fluid has continued as variations of brown discharge & pink tinged fluid. Pt reports contractions every 5-10 minutes and denies vaginal bleeding.  Denies recent intercourse. Positive fetal movement. All other systems negative.    Patient's last menstrual period was 06/17/2015.  OB History  Gravida Para Term Preterm AB Living  1            SAB TAB Ectopic Multiple Live Births               # Outcome Date GA Lbr Len/2nd Weight Sex Delivery Anes PTL Lv  1 Current               Past Medical History:  Diagnosis Date  . Asthma     History reviewed. No pertinent family history.  Social History   Social History  . Marital status: Single    Spouse name: N/A  . Number of children: N/A  . Years of education: N/A   Social History Main Topics  . Smoking status: Never Smoker  . Smokeless tobacco: Never Used  . Alcohol use No  . Drug use: No  . Sexual activity: Not Asked   Other Topics Concern  . None   Social History Narrative  . None    No Known Allergies  No current facility-administered medications on file prior to encounter.    Current Outpatient Prescriptions on File Prior to Encounter  Medication Sig Dispense Refill  . Prenatal Vit-Fe Fumarate-FA (PRENATAL MULTIVITAMIN) TABS tablet Take 3 tablets by mouth at bedtime.        Review of Systems  Gastrointestinal: Positive for abdominal pain.  Genitourinary: Positive for dysuria and vaginal discharge. Negative for vaginal bleeding.     Physical Exam   Vitals:   04/09/16 0706 04/09/16 0711  BP: 136/84 134/81  Pulse: 87 89  Resp: 18   Temp: 98.1 F (36.7 C)   TempSrc: Oral   SpO2: 100%     Physical Exam  Nursing note and vitals reviewed. Constitutional: She is oriented to  person, place, and time. She appears well-developed and well-nourished. No distress.  HENT:  Head: Normocephalic and atraumatic.  Eyes: Conjunctivae are normal. Right eye exhibits no discharge. Left eye exhibits no discharge. No scleral icterus.  Neck: Normal range of motion.  Cardiovascular:  No murmur heard. Respiratory: Effort normal. No respiratory distress.  Genitourinary:  Genitourinary Comments: + pooling small amount of pink tinged fluid  Neurological: She is alert and oriented to person, place, and time.  Skin: Skin is warm and dry. She is not diaphoretic.  Psychiatric: She has a normal mood and affect. Her behavior is normal. Judgment and thought content normal.   Fetal Tracing:  Baseline: 135 Variability: moderate Accelerations: 15x15 Decelerations: none  Toco: 2-5 MAU Course  Procedures Results for orders placed or performed during the hospital encounter of 04/09/16 (from the past 24 hour(s))  POCT fern test     Status: None   Collection Time: 04/09/16  7:54 AM  Result Value Ref Range   POCT Fern Test Positive = ruptured amniotic membanes     MDM Category 1 tracing + pooling, + fern  Assessment and Plan  A: 1. Full-term premature rupture of membranes, unspecified duration to onset of labor  P: RN to call Dr. Su Hilt for further instruction   Judeth Horn, NP 04/09/2016 7:41 AM

## 2016-04-09 NOTE — Progress Notes (Signed)
LABOR PROGRESS NOTE  Erica RacerShataura S Kuriakose is a 20 y.o. G1P0 at 6063w5d  admitted for SROM at 100pm 04/08/16  Subjective: Pt has inadequate labor pattern; iupc in place. Pt is using fentanyl for pain relief  Objective: BP 115/70   Pulse 81   Temp 98.7 F (37.1 C)   Resp 18   Ht 5\' 2"  (1.575 m)   Wt 203 lb (92.1 kg)   LMP 06/17/2015   SpO2 100%   BMI 37.13 kg/m  or  Vitals:   04/09/16 1310 04/09/16 1718 04/09/16 1731 04/09/16 1919  BP:   115/70   Pulse:   81   Resp: 18     Temp: 98.8 F (37.1 C) 98.7 F (37.1 C)  98.7 F (37.1 C)  TempSrc: Oral Oral    SpO2:      Weight:      Height:         SVE- 4/90/-2  Labs: Lab Results  Component Value Date   WBC 8.1 04/09/2016   HGB 11.4 (L) 04/09/2016   HCT 34.1 (L) 04/09/2016   MCV 84.6 04/09/2016   PLT 179 04/09/2016    Patient Active Problem List   Diagnosis Date Noted  . Active labor at term 04/09/2016  . Asthma 10/16/2015  . Spotting affecting pregnancy in second trimester 10/16/2015    Assessment / Plan:   Labor: pitocin  Fetal Wellbeing:  Category 1 Pain Control:  Fentanyl Anticipated MOD:  SVD  Lori A Clemmons 04/09/2016, 7:31 PM

## 2016-04-09 NOTE — Progress Notes (Signed)
Erica Barber is a 20 y.o. G1P0 at 8764w5d admitted for PROM  Subjective: No complaints  Objective: BP 127/77   Pulse (!) 122   Temp 98.8 F (37.1 C) (Oral)   Resp 18   Ht 5\' 2"  (1.575 m)   Wt 203 lb (92.1 kg)   LMP 06/17/2015   SpO2 100%   BMI 37.13 kg/m  No intake/output data recorded. Total I/O In: 240 [P.O.:240] Out: 100 [Emesis/NG output:100]  FHT:  FHR: 130 bpm, variability: min-mod,  accelerations:  Abscent,  decelerations:  Absent UC:   Not picking up well but appears q 2-563min SVE:   Dilation: 1 Effacement (%): 50 Station: -2 Exam by:: Marcelino DusterMichelle, RN   Labs: Lab Results  Component Value Date   WBC 8.1 04/09/2016   HGB 11.4 (L) 04/09/2016   HCT 34.1 (L) 04/09/2016   MCV 84.6 04/09/2016   PLT 179 04/09/2016    Assessment / Plan: Augmentation of Labor  Labor: Augmentation of labor, titrate pitocin per protocl Preeclampsia:  no signs or symptoms of toxicity Fetal Wellbeing:  Category I Pain Control:  upon request I/D:  GBS + on PCN Anticipated MOD:  NSVD  Sumayya Muha Y 04/09/2016, 2:55 PM

## 2016-04-10 ENCOUNTER — Encounter (HOSPITAL_COMMUNITY): Payer: Self-pay | Admitting: Family Medicine

## 2016-04-10 ENCOUNTER — Inpatient Hospital Stay (HOSPITAL_COMMUNITY): Payer: Medicaid Other | Admitting: Anesthesiology

## 2016-04-10 MED ORDER — OXYCODONE HCL 5 MG PO TABS: 5.0000 mg | ORAL_TABLET | ORAL | Status: DC | PRN

## 2016-04-10 MED ORDER — OXYCODONE HCL 5 MG PO TABS: 10.0000 mg | ORAL_TABLET | ORAL | Status: DC | PRN

## 2016-04-10 MED ORDER — DIBUCAINE 1 % RE OINT
1.0000 "application " | TOPICAL_OINTMENT | RECTAL | Status: DC | PRN
  Administered 2016-04-11: 1 via RECTAL
  Filled 2016-04-10: qty 28

## 2016-04-10 MED ORDER — ONDANSETRON HCL 4 MG/2ML IJ SOLN: 4.0000 mg | INTRAMUSCULAR | Status: DC | PRN

## 2016-04-10 MED ORDER — BENZOCAINE-MENTHOL 20-0.5 % EX AERO
1.0000 "application " | INHALATION_SPRAY | CUTANEOUS | Status: DC | PRN
  Administered 2016-04-11: 1 via TOPICAL
  Filled 2016-04-10: qty 56

## 2016-04-10 MED ORDER — WITCH HAZEL-GLYCERIN EX PADS
1.0000 "application " | MEDICATED_PAD | CUTANEOUS | Status: DC | PRN
  Administered 2016-04-11: 1 via TOPICAL

## 2016-04-10 MED ORDER — FERROUS SULFATE 325 (65 FE) MG PO TABS
325.0000 mg | ORAL_TABLET | Freq: Two times a day (BID) | ORAL | Status: DC
  Administered 2016-04-11 – 2016-04-12 (×3): 325 mg via ORAL
  Filled 2016-04-10 (×3): qty 1

## 2016-04-10 MED ORDER — SENNOSIDES-DOCUSATE SODIUM 8.6-50 MG PO TABS
2.0000 | ORAL_TABLET | ORAL | Status: DC
  Administered 2016-04-10 – 2016-04-11 (×2): 2 via ORAL
  Filled 2016-04-10 (×2): qty 2

## 2016-04-10 MED ORDER — LIDOCAINE HCL (PF) 1 % IJ SOLN
INTRAMUSCULAR | Status: DC | PRN
  Administered 2016-04-10 (×2): 4 mL

## 2016-04-10 MED ORDER — DIPHENHYDRAMINE HCL 25 MG PO CAPS: 25.0000 mg | ORAL_CAPSULE | Freq: Four times a day (QID) | ORAL | Status: DC | PRN

## 2016-04-10 MED ORDER — ZOLPIDEM TARTRATE 5 MG PO TABS: 5.0000 mg | ORAL_TABLET | Freq: Every evening | ORAL | Status: DC | PRN

## 2016-04-10 MED ORDER — ACETAMINOPHEN 325 MG PO TABS: 650.0000 mg | ORAL_TABLET | ORAL | Status: DC | PRN

## 2016-04-10 MED ORDER — IBUPROFEN 600 MG PO TABS
600.0000 mg | ORAL_TABLET | Freq: Four times a day (QID) | ORAL | Status: DC
  Administered 2016-04-10 – 2016-04-12 (×7): 600 mg via ORAL
  Filled 2016-04-10 (×7): qty 1

## 2016-04-10 MED ORDER — ONDANSETRON HCL 4 MG PO TABS: 4.0000 mg | ORAL_TABLET | ORAL | Status: DC | PRN

## 2016-04-10 MED ORDER — PRENATAL MULTIVITAMIN CH
1.0000 | ORAL_TABLET | Freq: Every day | ORAL | Status: DC
  Administered 2016-04-11 – 2016-04-12 (×2): 1 via ORAL
  Filled 2016-04-10 (×2): qty 1

## 2016-04-10 MED ORDER — COCONUT OIL OIL
1.0000 "application " | TOPICAL_OIL | Status: DC | PRN
  Administered 2016-04-11: 1 via TOPICAL
  Filled 2016-04-10: qty 120

## 2016-04-10 MED ORDER — SIMETHICONE 80 MG PO CHEW: 80.0000 mg | CHEWABLE_TABLET | ORAL | Status: DC | PRN

## 2016-04-10 MED ORDER — MISOPROSTOL 200 MCG PO TABS
ORAL_TABLET | ORAL | Status: AC
  Administered 2016-04-10: 1000 ug
  Filled 2016-04-10: qty 5

## 2016-04-10 NOTE — Anesthesia Preprocedure Evaluation (Signed)

## 2016-04-10 NOTE — Anesthesia Procedure Notes (Signed)
Epidural Patient location during procedure: OB  Staffing Anesthesiologist: Karie SchwalbeJUDD, Erica Barber Performed: anesthesiologist   Preanesthetic Checklist Completed: patient identified, site marked, surgical consent, pre-op evaluation, timeout performed, IV checked, risks and benefits discussed and monitors and equipment checked  Epidural Patient position: sitting Prep: site prepped and draped and DuraPrep Patient monitoring: continuous pulse ox and blood pressure Approach: midline Location: L3-L4 Injection technique: LOR saline  Needle:  Needle type: Tuohy  Needle gauge: 17 G Needle length: 9 cm and 9 Needle insertion depth: 5 cm cm Catheter type: closed end flexible Catheter size: 19 Gauge Catheter at skin depth: 10 cm Test dose: negative  Assessment Events: blood not aspirated, injection not painful, no injection resistance, negative IV test and paresthesia (L sided paresthesia when threading catheter, removed catheter and moved tuohy to the R, no paresthesia when threading this catheter and none present when injecting)  Additional Notes Patient identified. Risks/Benefits/Options discussed with patient including but not limited to bleeding, infection, nerve damage, paralysis, failed block, incomplete pain control, headache, blood pressure changes, nausea, vomiting, reactions to medication both or allergic, itching and postpartum back pain. Confirmed with bedside nurse the patient's most recent platelet count. Confirmed with patient that they are not currently taking any anticoagulation, have any bleeding history or any family history of bleeding disorders. Patient expressed understanding and wished to proceed. All questions were answered. Sterile technique was used throughout the entire procedure. Please see nursing notes for vital signs. Test dose was given through epidural catheter and negative prior to continuing to dose epidural or start infusion. Warning signs of high block given to the  patient including shortness of breath, tingling/numbness in hands, complete motor block, or any concerning symptoms with instructions to call for help. Patient was given instructions on fall risk and not to get out of bed. All questions and concerns addressed with instructions to call with any issues or inadequate analgesia.

## 2016-04-10 NOTE — Progress Notes (Addendum)
Erica Barber is a 20 y.o. G1P0 at [redacted]w[redacted]d admitted for  Premature rupture of membranes and labor augmentation  Subjective: Patient comfortable with epidural, but feels pressure in rectum, constant.   Objective: BP 127/70   Pulse 95   Temp 99.6 F (37.6 C) (Oral)   Resp 18   Ht 5\' 2"  (1.575 m)   Wt 92.1 kg (203 lb)   LMP 06/17/2015   SpO2 99%   BMI 37.13 kg/m  I/O last 3 completed shifts: In: 240 [P.O.:240] Out: 100 [Emesis/NG output:100] Total I/O In: -  Out: 400 [Urine:250; Emesis/NG output:150]  FHT:  FHR: 120 bpm, variability: moderate,  accelerations:  Present,  decelerations:  Absent UC:   regular, every 4 minutes SVE:   Dilation: Lip/rim Effacement (%): 100 Station: +1 Exam by:: Sallye OberKulwa, MD  Exam at 12 noon.   [place] units: 90 in 10 minutes (1135-1145).   Labs: Lab Results  Component Value Date   WBC 8.1 04/09/2016   HGB 11.4 (L) 04/09/2016   HCT 34.1 (L) 04/09/2016   MCV 84.6 04/09/2016   PLT 179 04/09/2016    Assessment / Plan: Augmentation of labor after PROM, no recent cervical change but with inadequate contractions  Labor: Tried having patient push and reduce the cervix unsuccessfully.  Will continue with pitocin titration, reassess in 1 hr. Consider arrest of dilation if no cervical change in total 6 hrs from when she became 9.5 cm, first found to be 9.5 cm at 8.15 am.   Fetal Wellbeing:  Category I Pain Control:  Epidural Anticipated MOD:  NSVD GBS prophylaxis.   [facility] HospitalKULWA,Joziah Dollins WAKURU 04/10/2016, 1:11 PM

## 2016-04-10 NOTE — Progress Notes (Signed)
LABOR PROGRESS NOTE  Erica Barber is a 20 y.o. G1P0 at 4137w6d  admitted for PPROM  Subjective: Ruptured 41 hours; Afebrile, comfortable with epidural  Objective: BP 128/67   Pulse 81   Temp 98.1 F (36.7 C) (Oral)   Resp 18   Ht 5\' 2"  (1.575 m)   Wt 203 lb (92.1 kg)   LMP 06/17/2015   SpO2 99%   BMI 37.13 kg/m  or  Vitals:   04/10/16 0544 04/10/16 0549 04/10/16 0554 04/10/16 0559  BP:      Pulse: 85 81 84 81  Resp:      Temp:      TempSrc:      SpO2: 99% 99% 99% 99%  Weight:      Height:         Dilation: 9 Effacement (%): 90 Cervical Position: Middle Station: 0 Presentation: Vertex Exam by:: L Clemmons  Labs: Lab Results  Component Value Date   WBC 8.1 04/09/2016   HGB 11.4 (L) 04/09/2016   HCT 34.1 (L) 04/09/2016   MCV 84.6 04/09/2016   PLT 179 04/09/2016    Patient Active Problem List   Diagnosis Date Noted  . Active labor at term 04/09/2016  . Asthma 10/16/2015  . Spotting affecting pregnancy in second trimester 10/16/2015    Assessment / Plan:   Labor: pitocin Fetal Wellbeing:  Cat 1 Pain Control:  epidural Anticipated MOD:  SVD   Lori A Clemmons 04/10/2016, 6:17 AM

## 2016-04-11 DIAGNOSIS — D649 Anemia, unspecified: Secondary | ICD-10-CM

## 2016-04-11 DIAGNOSIS — B951 Streptococcus, group B, as the cause of diseases classified elsewhere: Secondary | ICD-10-CM

## 2016-04-11 LAB — CBC
HEMATOCRIT: 23.6 % — AB (ref 36.0–46.0)
Hemoglobin: 8.1 g/dL — ABNORMAL LOW (ref 12.0–15.0)
MCH: 28.3 pg (ref 26.0–34.0)
MCHC: 34.3 g/dL (ref 30.0–36.0)
MCV: 82.5 fL (ref 78.0–100.0)
Platelets: 146 10*3/uL — ABNORMAL LOW (ref 150–400)
RBC: 2.86 MIL/uL — ABNORMAL LOW (ref 3.87–5.11)
RDW: 15.5 % (ref 11.5–15.5)
WBC: 14.7 10*3/uL — ABNORMAL HIGH (ref 4.0–10.5)

## 2016-04-11 MED ORDER — MEDROXYPROGESTERONE ACETATE 150 MG/ML IM SUSP
150.0000 mg | Freq: Once | INTRAMUSCULAR | Status: AC
  Administered 2016-04-12: 150 mg via INTRAMUSCULAR
  Filled 2016-04-11: qty 1

## 2016-04-11 NOTE — Progress Notes (Signed)
Erica Barber, [place] S Female, 20 y.o., 13-Mar-1996  Post Partum Day 1 Subjective: no complaints  Objective: Blood pressure 125/66, pulse 89, temperature 99.4 F (37.4 C), temperature source Oral, resp. rate 16, height 5\' 2"  (1.575 m), weight 92.1 kg (203 lb), last menstrual period 06/17/2015, SpO2 100 %, unknown if currently breastfeeding.  Physical Exam:  General: alert, cooperative and no distress Lochia: appropriate Uterine Fundus: firm DVT Evaluation: No evidence of DVT seen on physical exam. No significant calf/ankle edema.   Recent Labs  04/09/16 0835 04/11/16 0522  HGB 11.4* 8.1*  HCT 34.1* 23.6*    Assessment/Plan: Plan for discharge tomorrow, Breastfeeding and Contraception Depo provera  Discussed risks, benefits and alternatives of Depo-Provera including but not limited to risks of irregular bleeding and weight gain.  Discussed maximum Depo-Provera use of two years.    Iron tabs for anemia     LOS: 2 days   [facility] Center[facility]ULWA,Shaylynn Nulty [facility]WAKURU 04/11/2016, 8:39 AM

## 2016-04-11 NOTE — Anesthesia Postprocedure Evaluation (Signed)
Anesthesia Post Note  Patient: Erica Barber  Procedure(s) Performed: * No procedures listed *  Patient location during evaluation: Mother Baby Anesthesia Type: Epidural Level of consciousness: awake and alert Pain management: pain level controlled Vital Signs Assessment: post-procedure vital signs reviewed and stable Respiratory status: spontaneous breathing, nonlabored ventilation and respiratory function stable Cardiovascular status: stable Postop Assessment: no headache, no backache and epidural receding Anesthetic complications: no     Last Vitals:  Vitals:   04/10/16 1721 04/11/16 0200  BP: 123/70 125/66  Pulse: 83 89  Resp: 18 16  Temp: 37.4 C     Last Pain:  Vitals:   04/11/16 0540  TempSrc:   PainSc: 1    Pain Goal: Patients Stated Pain Goal: 10 (04/09/16 1100)               Emalina Dubreuil

## 2016-04-11 NOTE — Lactation Note (Signed)
This note was copied from a baby's chart. Lactation Consultation Note Second visit this shift. Mom was sleeping first attempt. Mom didn't appear to want to talk a lot. Mom states BF going well. Mom was holding baby in cradle position. Mm has pendulum breast w/nipple to end of nipple flat in shape of breast. Compressible areola and nipple. Encouraged mom to obtain a deep latch. Referred to Baby and me book. Mom has WIC. WH/LC brochure given w/resources, support groups and LC services. Encouraged STS, I&O, supply and demand.  Patient Name: Erica Barber ZOXWR'UToday's Date: 04/11/2016 Reason for consult: Initial assessment   Maternal Data Has patient been taught Hand Expression?: Yes Does the patient have breastfeeding experience prior to this delivery?: No  Feeding Feeding Type: Breast Fed Length of feed: 10 min  LATCH Score/Interventions Latch: Grasps breast easily, tongue down, lips flanged, rhythmical sucking. Intervention(s): Assist with latch;Adjust position  Audible Swallowing: Spontaneous and intermittent Intervention(s): Skin to skin  Type of Nipple: Flat Intervention(s): Reverse pressure  Comfort (Breast/Nipple): Soft / non-tender     Hold (Positioning): Assistance needed to correctly position infant at breast and maintain latch. Intervention(s): Support Pillows;Skin to skin;Position options  LATCH Score: 9  Lactation Tools Discussed/Used WIC Program: Yes   Consult Status Consult Status: Follow-up Date: 04/11/16 Follow-up type: In-patient    Marvella Jenning, Diamond NickelLAURA G 04/11/2016, 7:09 AM

## 2016-04-11 NOTE — Discharge Instructions (Signed)
Postpartum Depression and Baby Blues The postpartum period begins right after the birth of a baby. During this time, there is often a great amount of joy and excitement. It is also a time of many changes in the life of the parents. Regardless of how many times a mother gives birth, each child brings new challenges and dynamics to the family. It is not unusual to have feelings of excitement along with confusing shifts in moods, emotions, and thoughts. All mothers are at risk of developing postpartum depression or the "baby blues." These mood changes can occur right after giving birth, or they may occur many months after giving birth. The baby blues or postpartum depression can be mild or severe. Additionally, postpartum depression can go away rather quickly, or it can be a long-term condition.  CAUSES Raised hormone levels and the rapid drop in those levels are thought to be a main cause of postpartum depression and the baby blues. A number of hormones change during and after pregnancy. Estrogen and progesterone usually decrease right after the delivery of your baby. The levels of thyroid hormone and various cortisol steroids also rapidly drop. Other factors that play a role in these mood changes include major life events and genetics.  RISK FACTORS If you have any of the following risks for the baby blues or postpartum depression, know what symptoms to watch out for during the postpartum period. Risk factors that may increase the likelihood of getting the baby blues or postpartum depression include:  Having a personal or family history of depression.   Having depression while being pregnant.   Having premenstrual mood issues or mood issues related to oral contraceptives.  Having a lot of life stress.   Having marital conflict.   Lacking a social support network.   Having a baby with special needs.   Having health problems, such as diabetes.  SIGNS AND SYMPTOMS Symptoms of baby blues  include:  Brief changes in mood, such as going from extreme happiness to sadness.  Decreased concentration.   Difficulty sleeping.   Crying spells, tearfulness.   Irritability.   Anxiety.  Symptoms of postpartum depression typically begin within the first month after giving birth. These symptoms include:  Difficulty sleeping or excessive sleepiness.   Marked weight loss.   Agitation.   Feelings of worthlessness.   Lack of interest in activity or food.  Postpartum psychosis is a very serious condition and can be dangerous. Fortunately, it is rare. Displaying any of the following symptoms is cause for immediate medical attention. Symptoms of postpartum psychosis include:   Hallucinations and delusions.   Bizarre or disorganized behavior.   Confusion or disorientation.  DIAGNOSIS  A diagnosis is made by an evaluation of your symptoms. There are no medical or lab tests that lead to a diagnosis, but there are various questionnaires that a health care provider may use to identify those with the baby blues, postpartum depression, or psychosis. Often, a screening tool called the [place] Postnatal Depression Scale is used to diagnose depression in the postpartum period.  TREATMENT The baby blues usually goes away on its own in 1-2 weeks. Social support is often all that is needed. You will be encouraged to get adequate sleep and rest. Occasionally, you may be given medicines to help you sleep.  Postpartum depression requires treatment because it can last several months or longer if it is not treated. Treatment may include individual or group therapy, medicine, or both to address any social, physiological, and psychological  factors that may play a role in the depression. Regular exercise, a healthy diet, rest, and social support may also be strongly recommended.  Postpartum psychosis is more serious and needs treatment right away. Hospitalization is often needed. HOME CARE  INSTRUCTIONS  Get as much rest as you can. Nap when the baby sleeps.   Exercise regularly. Some women find yoga and walking to be beneficial.   Eat a balanced and nourishing diet.   Do little things that you enjoy. Have a cup of tea, take a bubble bath, read your favorite magazine, or listen to your favorite music.  Avoid alcohol.   Ask for help with household chores, cooking, grocery shopping, or running errands as needed. Do not try to do everything.   Talk to people close to you about how you are feeling. Get support from your partner, family members, friends, or other new moms.  Try to stay positive in how you think. Think about the things you are grateful for.   Do not spend a lot of time alone.   Only take over-the-counter or prescription medicine as directed by your health care provider.  Keep all your postpartum appointments.   Let your health care provider know if you have any concerns.  SEEK MEDICAL CARE IF: You are having a reaction to or problems with your medicine. SEEK IMMEDIATE MEDICAL CARE IF:  You have suicidal feelings.   You think you may harm the baby or someone else. MAKE SURE YOU:  Understand these instructions.  Will watch your condition.  Will get help right away if you are not doing well or get worse.   This information is not intended to replace advice given to you by your health care provider. Make sure you discuss any questions you have with your health care provider.   Document Released: 04/07/2004 Document Revised: 07/09/2013 Document Reviewed: 04/15/2013 Elsevier Interactive Patient Education 2016 [employer] Breastfeeding and Mastitis Mastitis is inflammation of the breast tissue. It can occur in women who are breastfeeding. This can make breastfeeding painful. Mastitis will sometimes go away on its own. Your health care provider will help determine if treatment is needed. CAUSES Mastitis is often associated with a blocked  milk (lactiferous) duct. This can happen when too much milk builds up in the breast. Causes of excess milk in the breast can include:  Poor latch-on. If your baby is not latched onto the breast properly, she or he may not empty your breast completely while breastfeeding.  Allowing too much time to pass between feedings.  Wearing a bra or other clothing that is too tight. This puts extra pressure on the lactiferous ducts so milk does not flow through them as it should. Mastitis can also be caused by a bacterial infection. Bacteria may enter the breast tissue through cuts or openings in the skin. In women who are breastfeeding, this may occur because of cracked or irritated skin. Cracks in the skin are often caused when your baby does not latch on properly to the breast. SIGNS AND SYMPTOMS  Swelling, redness, tenderness, and pain in an area of the breast.  Swelling of the glands under the arm on the same side.  Fever may or may not accompany mastitis. If an infection is allowed to progress, a collection of pus (abscess) may develop. DIAGNOSIS  Your health care provider can usually diagnose mastitis based on your symptoms and a physical exam. Tests may be done to help confirm the diagnosis. These may include:  Removal  of pus from the breast by applying pressure to the area. This pus can be examined in the lab to determine which bacteria are present. If an abscess has developed, the fluid in the abscess can be removed with a needle. This can also be used to confirm the diagnosis and determine the bacteria present. In most cases, pus will not be present.  Blood tests to determine if your body is fighting a bacterial infection.  Mammogram or ultrasound tests to rule out other problems or diseases. TREATMENT  Mastitis that occurs with breastfeeding will sometimes go away on its own. Your health care provider may choose to wait 24 hours after first seeing you to decide whether a prescription  medicine is needed. If your symptoms are worse after 24 hours, your health care provider will likely prescribe an antibiotic medicine to treat the mastitis. He or she will determine which bacteria are most likely causing the infection and will then select an appropriate antibiotic medicine. This is sometimes changed based on the results of tests performed to identify the bacteria, or if there is no response to the antibiotic medicine selected. Antibiotic medicines are usually given by mouth. You may also be given medicine for pain. HOME CARE INSTRUCTIONS  Only take over-the-counter or prescription medicines for pain, fever, or discomfort as directed by your health care provider.  If your health care provider prescribed an antibiotic medicine, take the medicine as directed. Make sure you finish it even if you start to feel better.  Do not wear a tight or underwire bra. Wear a soft, supportive bra.  Increase your fluid intake, especially if you have a fever.  Continue to empty the breast. Your health care provider can tell you whether this milk is safe for your infant or needs to be thrown out. You may be told to stop nursing until your health care provider thinks it is safe for your baby. Use a breast pump if you are advised to stop nursing.  Keep your nipples clean and dry.  Empty the first breast completely before going to the other breast. If your baby is not emptying your breasts completely for some reason, use a breast pump to empty your breasts.  If you go back to work, pump your breasts while at work to stay in time with your nursing schedule.  Avoid allowing your breasts to become overly filled with milk (engorged). SEEK MEDICAL CARE IF:  You have pus-like discharge from the breast.  Your symptoms do not improve with the treatment prescribed by your health care provider within 2 days. SEEK IMMEDIATE MEDICAL CARE IF:  Your pain and swelling are getting worse.  You have pain that is  not controlled with medicine.  You have a red line extending from the breast toward your armpit.  You have a fever or persistent symptoms for more than 2-3 days.  You have a fever and your symptoms suddenly get worse. MAKE SURE YOU:   Understand these instructions.  Will watch your condition.  Will get help right away if you are not doing well or get worse.   This information is not intended to replace advice given to you by your health care provider. Make sure you discuss any questions you have with your health care provider.   Document Released: 10/29/2004 Document Revised: 07/09/2013 Document Reviewed: 02/07/2013 Elsevier Interactive Patient Education [employer]. Breastfeeding Deciding to breastfeed is one of the best choices you can make for you and your baby. A change  in hormones during pregnancy causes your breast tissue to grow and increases the number and size of your milk ducts. These hormones also allow proteins, sugars, and fats from your blood supply to make breast milk in your milk-producing glands. Hormones prevent breast milk from being released before your baby is born as well as prompt milk flow after birth. Once breastfeeding has begun, thoughts of your baby, as well as his or her sucking or crying, can stimulate the release of milk from your milk-producing glands.  BENEFITS OF BREASTFEEDING For Your Baby  Your first milk (colostrum) helps your baby's digestive system function better.  There are antibodies in your milk that help your baby fight off infections.  Your baby has a lower incidence of asthma, allergies, and sudden infant death syndrome.  The nutrients in breast milk are better for your baby than infant formulas and are designed uniquely for your baby's needs.  Breast milk improves your baby's brain development.  Your baby is less likely to develop other conditions, such as childhood obesity, asthma, or type 2 diabetes mellitus. For  You  Breastfeeding helps to create a very special bond between you and your baby.  Breastfeeding is convenient. Breast milk is always available at the correct temperature and costs nothing.  Breastfeeding helps to burn calories and helps you lose the weight gained during pregnancy.  Breastfeeding makes your uterus contract to its prepregnancy size faster and slows bleeding (lochia) after you give birth.   Breastfeeding helps to lower your risk of developing type 2 diabetes mellitus, osteoporosis, and breast or ovarian cancer later in life. SIGNS THAT YOUR BABY IS HUNGRY Early Signs of Hunger  Increased alertness or activity.  Stretching.  Movement of the head from side to side.  Movement of the head and opening of the mouth when the corner of the mouth or cheek is stroked (rooting).  Increased sucking sounds, smacking lips, cooing, sighing, or squeaking.  Hand-to-mouth movements.  Increased sucking of fingers or hands. Late Signs of Hunger  Fussing.  Intermittent crying. Extreme Signs of Hunger Signs of extreme hunger will require calming and consoling before your baby will be able to breastfeed successfully. Do not wait for the following signs of extreme hunger to occur before you initiate breastfeeding:  Restlessness.  A loud, strong cry.  Screaming. BREASTFEEDING BASICS Breastfeeding Initiation  Find a comfortable place to sit or lie down, with your neck and back well supported.  Place a pillow or rolled up blanket under your baby to bring him or her to the level of your breast (if you are seated). Nursing pillows are specially designed to help support your arms and your baby while you breastfeed.  Make sure that your baby's abdomen is facing your abdomen.  Gently massage your breast. With your fingertips, massage from your chest wall toward your nipple in a circular motion. This encourages milk flow. You may need to continue this action during the feeding if your  milk flows slowly.  Support your breast with 4 fingers underneath and your thumb above your nipple. Make sure your fingers are well away from your nipple and your baby's mouth.  Stroke your baby's lips gently with your finger or nipple.  When your baby's mouth is open wide enough, quickly bring your baby to your breast, placing your entire nipple and as much of the colored area around your nipple (areola) as possible into your baby's mouth.  More areola should be visible above your baby's upper lip than  below the lower lip.  Your baby's tongue should be between his or her lower gum and your breast.  Ensure that your baby's mouth is correctly positioned around your nipple (latched). Your baby's lips should create a seal on your breast and be turned out (everted).  It is common for your baby to suck about 2-3 minutes in order to start the flow of breast milk. Latching Teaching your baby how to latch on to your breast properly is very important. An improper latch can cause nipple pain and decreased milk supply for you and poor weight gain in your baby. Also, if your baby is not latched onto your nipple properly, he or she may swallow some air during feeding. This can make your baby fussy. Burping your baby when you switch breasts during the feeding can help to get rid of the air. However, teaching your baby to latch on properly is still the best way to prevent fussiness from swallowing air while breastfeeding. Signs that your baby has successfully latched on to your nipple:  Silent tugging or silent sucking, without causing you pain.  Swallowing heard between every 3-4 sucks.  Muscle movement above and in front of his or her ears while sucking. Signs that your baby has not successfully latched on to nipple:  Sucking sounds or smacking sounds from your baby while breastfeeding.  Nipple pain. If you think your baby has not latched on correctly, slip your finger into the corner of your baby's  mouth to break the suction and place it between your baby's gums. Attempt breastfeeding initiation again. Signs of Successful Breastfeeding Signs from your baby:  A gradual decrease in the number of sucks or complete cessation of sucking.  Falling asleep.  Relaxation of his or her body.  Retention of a small amount of milk in his or her mouth.  Letting go of your breast by himself or herself. Signs from you:  Breasts that have increased in firmness, weight, and size 1-3 hours after feeding.  Breasts that are softer immediately after breastfeeding.  Increased milk volume, as well as a change in milk consistency and color by the fifth day of breastfeeding.  Nipples that are not sore, cracked, or bleeding. Signs That Your Pecola Leisure is Getting Enough Milk  Wetting at least 3 diapers in a 24-hour period. The urine should be clear and pale yellow by age 67 days.  At least 3 stools in a 24-hour period by age 67 days. The stool should be soft and yellow.  At least 3 stools in a 24-hour period by age 81 days. The stool should be seedy and yellow.  No loss of weight greater than 10% of birth weight during the first 13 days of age.  Average weight gain of 4-7 ounces (113-198 g) per week after age 65 days.  Consistent daily weight gain by age 67 days, without weight loss after the age of 2 weeks. After a feeding, your baby may spit up a small amount. This is common. BREASTFEEDING FREQUENCY AND DURATION Frequent feeding will help you make more milk and can prevent sore nipples and breast engorgement. Breastfeed when you feel the need to reduce the fullness of your breasts or when your baby shows signs of hunger. This is called "breastfeeding on demand." Avoid introducing a pacifier to your baby while you are working to establish breastfeeding (the first 4-6 weeks after your baby is born). After this time you may choose to use a pacifier. Research has shown that pacifier  use during the first year of a  baby's life decreases the risk of sudden infant death syndrome (SIDS). Allow your baby to feed on each breast as long as he or she wants. Breastfeed until your baby is finished feeding. When your baby unlatches or falls asleep while feeding from the first breast, offer the second breast. Because newborns are often sleepy in the first few weeks of life, you may need to awaken your baby to get him or her to feed. Breastfeeding times will vary from baby to baby. However, the following rules can serve as a guide to help you ensure that your baby is properly fed:  Newborns (babies 97 weeks of age or younger) may breastfeed every 1-3 hours.  Newborns should not go longer than 3 hours during the day or 5 hours during the night without breastfeeding.  You should breastfeed your baby a minimum of 8 times in a 24-hour period until you begin to introduce solid foods to your baby at around 59 months of age. BREAST MILK PUMPING Pumping and storing breast milk allows you to ensure that your baby is exclusively fed your breast milk, even at times when you are unable to breastfeed. This is especially important if you are going back to work while you are still breastfeeding or when you are not able to be present during feedings. Your lactation consultant can give you guidelines on how long it is safe to store breast milk. A breast pump is a machine that allows you to pump milk from your breast into a sterile bottle. The pumped breast milk can then be stored in a refrigerator or freezer. Some breast pumps are operated by hand, while others use electricity. Ask your lactation consultant which type will work best for you. Breast pumps can be purchased, but some hospitals and breastfeeding support groups lease breast pumps on a monthly basis. A lactation consultant can teach you how to hand express breast milk, if you prefer not to use a pump. CARING FOR YOUR BREASTS WHILE YOU BREASTFEED Nipples can become dry, cracked, and  sore while breastfeeding. The following recommendations can help keep your breasts moisturized and healthy:  Avoid using soap on your nipples.  Wear a supportive bra. Although not required, special nursing bras and tank tops are designed to allow access to your breasts for breastfeeding without taking off your entire bra or top. Avoid wearing underwire-style bras or extremely tight bras.  Air dry your nipples for 3-16minutes after each feeding.  Use only cotton bra pads to absorb leaked breast milk. Leaking of breast milk between feedings is normal.  Use lanolin on your nipples after breastfeeding. Lanolin helps to maintain your skin's normal moisture barrier. If you use pure lanolin, you do not need to wash it off before feeding your baby again. Pure lanolin is not toxic to your baby. You may also hand express a few drops of breast milk and gently massage that milk into your nipples and allow the milk to air dry. In the first few weeks after giving birth, some women experience extremely full breasts (engorgement). Engorgement can make your breasts feel heavy, warm, and tender to the touch. Engorgement peaks within 3-5 days after you give birth. The following recommendations can help ease engorgement:  Completely empty your breasts while breastfeeding or pumping. You may want to start by applying warm, moist heat (in the shower or with warm water-soaked hand towels) just before feeding or pumping. This increases circulation and helps the milk  flow. If your baby does not completely empty your breasts while breastfeeding, pump any extra milk after he or she is finished.  Wear a snug bra (nursing or regular) or tank top for 1-2 days to signal your body to slightly decrease milk production.  Apply ice packs to your breasts, unless this is too uncomfortable for you.  Make sure that your baby is latched on and positioned properly while breastfeeding. If engorgement persists after 48 hours of following  these recommendations, contact your health care provider or a Science writer. OVERALL HEALTH CARE RECOMMENDATIONS WHILE BREASTFEEDING  Eat healthy foods. Alternate between meals and snacks, eating 3 of each per day. Because what you eat affects your breast milk, some of the foods may make your baby more irritable than usual. Avoid eating these foods if you are sure that they are negatively affecting your baby.  Drink milk, fruit juice, and water to satisfy your thirst (about 10 glasses a day).  Rest often, relax, and continue to take your prenatal vitamins to prevent fatigue, stress, and anemia.  Continue breast self-awareness checks.  Avoid chewing and smoking tobacco. Chemicals from cigarettes that pass into breast milk and exposure to secondhand smoke may harm your baby.  Avoid alcohol and drug use, including marijuana. Some medicines that may be harmful to your baby can pass through breast milk. It is important to ask your health care provider before taking any medicine, including all over-the-counter and prescription medicine as well as vitamin and herbal supplements. It is possible to become pregnant while breastfeeding. If birth control is desired, ask your health care provider about options that will be safe for your baby. SEEK MEDICAL CARE IF:  You feel like you want to stop breastfeeding or have become frustrated with breastfeeding.  You have painful breasts or nipples.  Your nipples are cracked or bleeding.  Your breasts are red, tender, or warm.  You have a swollen area on either breast.  You have a fever or chills.  You have nausea or vomiting.  You have drainage other than breast milk from your nipples.  Your breasts do not become full before feedings by the fifth day after you give birth.  You feel sad and depressed.  Your baby is too sleepy to eat well.  Your baby is having trouble sleeping.   Your baby is wetting less than 3 diapers in a 24-hour  period.  Your baby has less than 3 stools in a 24-hour period.  Your baby's skin or the white part of his or her eyes becomes yellow.   Your baby is not gaining weight by 35 days of age. SEEK IMMEDIATE MEDICAL CARE IF:  Your baby is overly tired (lethargic) and does not want to wake up and feed.  Your baby develops an unexplained fever.   This information is not intended to replace advice given to you by your health care provider. Make sure you discuss any questions you have with your health care provider.   Document Released: 07/04/2005 Document Revised: 03/25/2015 Document Reviewed: 12/26/2012 Elsevier Interactive Patient Education 2016 [employer]. Anemia, Nonspecific Anemia is a condition in which the concentration of red blood cells or hemoglobin in the blood is below normal. Hemoglobin is a substance in red blood cells that carries oxygen to the tissues of the body. Anemia results in not enough oxygen reaching these tissues.  CAUSES  Common causes of anemia include:   Excessive bleeding. Bleeding may be internal or external. This includes excessive bleeding  from periods (in women) or from the intestine.   Poor nutrition.   Chronic kidney, thyroid, and liver disease.  Bone marrow disorders that decrease red blood cell production.  Cancer and treatments for cancer.  HIV, AIDS, and their treatments.  Spleen problems that increase red blood cell destruction.  Blood disorders.  Excess destruction of red blood cells due to infection, medicines, and autoimmune disorders. SIGNS AND SYMPTOMS   Minor weakness.   Dizziness.   Headache.  Palpitations.   Shortness of breath, especially with exercise.   Paleness.  Cold sensitivity.  Indigestion.  Nausea.  Difficulty sleeping.  Difficulty concentrating. Symptoms may occur suddenly or they may develop slowly.  DIAGNOSIS  Additional blood tests are often needed. These help your health care provider  determine the best treatment. Your health care provider will check your stool for blood and look for other causes of blood loss.  TREATMENT  Treatment varies depending on the cause of the anemia. Treatment can include:   Supplements of iron, vitamin I71, or folic acid.   Hormone medicines.   A blood transfusion. This may be needed if blood loss is severe.   Hospitalization. This may be needed if there is significant continual blood loss.   Dietary changes.  Spleen removal. HOME CARE INSTRUCTIONS Keep all follow-up appointments. It often takes many weeks to correct anemia, and having your health care provider check on your condition and your response to treatment is very important. SEEK IMMEDIATE MEDICAL CARE IF:   You develop extreme weakness, shortness of breath, or chest pain.   You become dizzy or have trouble concentrating.  You develop heavy vaginal bleeding.   You develop a rash.   You have bloody or black, tarry stools.   You faint.   You vomit up blood.   You vomit repeatedly.   You have abdominal pain.  You have a fever or persistent symptoms for more than 2-3 days.   You have a fever and your symptoms suddenly get worse.   You are dehydrated.  MAKE SURE YOU:  Understand these instructions.  Will watch your condition.  Will get help right away if you are not doing well or get worse.   This information is not intended to replace advice given to you by your health care provider. Make sure you discuss any questions you have with your health care provider.   Document Released: 08/11/2004 Document Revised: 03/06/2013 Document Reviewed: 12/28/2012 Elsevier Interactive Patient Education 2016 [employer]. Iron-Rich Diet Iron is a mineral that helps your body to produce hemoglobin. Hemoglobin is a protein in your red blood cells that carries oxygen to your body's tissues. Eating too little iron may cause you to feel weak and tired, and it can  increase your risk for infection. Eating enough iron is necessary for your body's metabolism, muscle function, and nervous system. Iron is naturally found in many foods. It can also be added to foods or fortified in foods. There are two types of dietary iron:  Heme iron. Heme iron is absorbed by the body more easily than nonheme iron. Heme iron is found in meat, poultry, and fish.  Nonheme iron. Nonheme iron is found in dietary supplements, iron-fortified grains, beans, and vegetables. You may need to follow an iron-rich diet if:  You have been diagnosed with iron deficiency or iron-deficiency anemia.  You have a condition that prevents you from absorbing dietary iron, such as:  Infection in your intestines.  Celiac disease. This involves long-lasting (chronic)  inflammation of your intestines.  You do not eat enough iron.  You eat a diet that is high in foods that impair iron absorption.  You have lost a lot of blood.  You have heavy bleeding during your menstrual cycle.  You are pregnant. WHAT IS MY PLAN? Your health care provider may help you to determine how much iron you need per day based on your condition. Generally, when a person consumes sufficient amounts of iron in the diet, the following iron needs are met:  Men.  26-39 years old: 11 mg per day.  45-107 years old: 8 mg per day.  Women.   14-63 years old: 15 mg per day.  52-36 years old: 18 mg per day.  Over 83 years old: 8 mg per day.  Pregnant women: 27 mg per day.  Breastfeeding women: 9 mg per day. WHAT DO I NEED TO KNOW ABOUT AN IRON-RICH DIET?  Eat fresh fruits and vegetables that are high in vitamin C along with foods that are high in iron. This will help increase the amount of iron that your body absorbs from food, especially with foods containing nonheme iron. Foods that are high in vitamin C include oranges, peppers, tomatoes, and mango.  Take iron supplements only as directed by your health care  provider. Overdose of iron can be life-threatening. If you were prescribed iron supplements, take them with orange juice or a vitamin C supplement.  Cook foods in pots and pans that are made from iron.   Eat nonheme iron-containing foods alongside foods that are high in heme iron. This helps to improve your iron absorption.   Certain foods and drinks contain compounds that impair iron absorption. Avoid eating these foods in the same meal as iron-rich foods or with iron supplements. These include:  Coffee, black tea, and red wine.  Milk, dairy products, and foods that are high in calcium.  Beans, soybeans, and peas.  Whole grains.  When eating foods that contain both nonheme iron and compounds that impair iron absorption, follow these tips to absorb iron better.   Soak beans overnight before cooking.  Soak whole grains overnight and drain them before using.  Ferment flours before baking, such as using yeast in bread dough. WHAT FOODS CAN I EAT? Grains Iron-fortified breakfast cereal. Iron-fortified whole-wheat bread. Enriched rice. Sprouted grains. Vegetables Spinach. Potatoes with skin. Green peas. Broccoli. Red and green bell peppers. Fermented vegetables. Fruits Prunes. Raisins. Oranges. Strawberries. Mango. Grapefruit. Meats and Other Protein Sources Beef liver. Oysters. Beef. Shrimp. [place]. Chicken. [place]. Sardines. Chickpeas. Nuts. Tofu. Beverages Tomato juice. Fresh orange juice. Prune juice. Hibiscus tea. Fortified instant breakfast shakes. Condiments Tahini. Fermented soy sauce. Sweets and Desserts Black-strap molasses.  Other Wheat germ. The items listed above may not be a complete list of recommended foods or beverages. Contact your dietitian for more options. WHAT FOODS ARE NOT RECOMMENDED? Grains Whole grains. Bran cereal. Bran flour. Oats. Vegetables Artichokes. Brussels sprouts. Kale. Fruits Blueberries. Raspberries. Strawberries.  Figs. Meats and Other Protein Sources Soybeans. Products made from soy protein. Dairy Milk. Cream. Cheese. Yogurt. Cottage cheese. Beverages Coffee. Black tea. Red wine. Sweets and Desserts Cocoa. Chocolate. Ice cream. Other Basil. Oregano. Parsley. The items listed above may not be a complete list of foods and beverages to avoid. Contact your dietitian for more information.   This information is not intended to replace advice given to you by your health care provider. Make sure you discuss any questions you have with your health care  provider.   Document Released: 02/15/2005 Document Revised: 07/25/2014 Document Reviewed: 01/29/2014 Elsevier Interactive Patient Education 2016 [place]. Postpartum Care After Vaginal Delivery After you deliver your newborn (postpartum period), the usual stay in the hospital is 24-72 hours. If there were problems with your labor or delivery, or if you have other medical problems, you might be in the hospital longer.  While you are in the hospital, you will receive help and instructions on how to care for yourself and your newborn during the postpartum period.  While you are in the hospital:  Be sure to tell your nurses if you have pain or discomfort, as well as where you feel the pain and what makes the pain worse.  If you had an incision made near your vagina (episiotomy) or if you had some tearing during delivery, the nurses may put ice packs on your episiotomy or tear. The ice packs may help to reduce the pain and swelling.  If you are breastfeeding, you may feel uncomfortable contractions of your uterus for a couple of weeks. This is normal. The contractions help your uterus get back to normal size.  It is normal to have some bleeding after delivery.  For the first 1-3 days after delivery, the flow is red and the amount may be similar to a period.  It is common for the flow to start and stop.  In the first few days, you may pass some  small clots. Let your nurses know if you begin to pass large clots or your flow increases.  Do not  flush blood clots down the toilet before having the nurse look at them.  During the next 3-10 days after delivery, your flow should become more watery and pink or brown-tinged in color.  Ten to fourteen days after delivery, your flow should be a small amount of yellowish-white discharge.  The amount of your flow will decrease over the first few weeks after delivery. Your flow may stop in 6-8 weeks. Most women have had their flow stop by 12 weeks after delivery.  You should change your sanitary pads frequently.  Wash your hands thoroughly with soap and water for at least 20 seconds after changing pads, using the toilet, or before holding or feeding your newborn.  You should feel like you need to empty your bladder within the first 6-8 hours after delivery.  In case you become weak, lightheaded, or faint, call your nurse before you get out of bed for the first time and before you take a shower for the first time.  Within the first few days after delivery, your breasts may begin to feel tender and full. This is called engorgement. Breast tenderness usually goes away within 48-72 hours after engorgement occurs. You may also notice milk leaking from your breasts. If you are not breastfeeding, do not stimulate your breasts. Breast stimulation can make your breasts produce more milk.  Spending as much time as possible with your newborn is very important. During this time, you and your newborn can feel close and get to know each other. Having your newborn stay in your room (rooming in) will help to strengthen the bond with your newborn. It will give you time to get to know your newborn and become comfortable caring for your newborn.  Your hormones change after delivery. Sometimes the hormone changes can temporarily cause you to feel sad or tearful. These feelings should not last more than a few days. If  these feelings last longer than that,  you should talk to your caregiver.  If desired, talk to your caregiver about methods of family planning or contraception.  Talk to your caregiver about immunizations. Your caregiver may want you to have the following immunizations before leaving the hospital:  Tetanus, diphtheria, and pertussis (Tdap) or tetanus and diphtheria (Td) immunization. It is very important that you and your family (including grandparents) or others caring for your newborn are up-to-date with the Tdap or Td immunizations. The Tdap or Td immunization can help protect your newborn from getting ill.  Rubella immunization.  Varicella (chickenpox) immunization.  Influenza immunization. You should receive this annual immunization if you did not receive the immunization during your pregnancy.   This information is not intended to replace advice given to you by your health care provider. Make sure you discuss any questions you have with your health care provider.   Document Released: 05/01/2007 Document Revised: 03/28/2012 Document Reviewed: 02/29/2012 Elsevier Interactive Patient Education [employer].

## 2016-04-11 NOTE — Progress Notes (Signed)
Rubella status not seen in prenatal records. Dr. Sallye OberKulwa found a rubella screen drawn 09/24/15 that was 6.3 making the pt rubella immune.

## 2016-04-11 NOTE — Discharge Summary (Signed)
[facility] Discharge Summary   Patient Name:   Erica Barber DOB:     Jun 03, 1996 MRN:     [id number]  Date of Admission:   04/09/2016 Date of Discharge:  04/12/2016  Admitting diagnosis:    40.5 weeks in pain cramping hurts bad to urinate fluid mixed with urine Principal Problem:   Vaginal delivery Active Problems:   Group beta Strep positive   Anemia  Term Pregnancy Delivered    Discharge diagnosis:    40.5 weeks in pain cramping hurts bad to urinate fluid mixed with urine Principal Problem:   Vaginal delivery Active Problems:   Group beta Strep positive   Anemia  Term Pregnancy Delivered and Anemia                                                                     Post partum procedures: None  Type of Delivery:  SVB  Delivering Provider: Hoover Browns   Date of Delivery:  04/10/16  Newborn Data:    Live born female  Birth Weight: 8 lb 9.9 oz (3910 g) APGARS: 7, 9 Circumcision scheduled at [facility] on 04/13/16  Baby's Name:              [name] Baby Feeding:   Breast Disposition:   home with mother  Complications:   ROM>24 hours, Shoulder Dystocia  Hospital course:      Onset of Labor With Vaginal Delivery     20 y.o. yo G1P1001 at [redacted]w[redacted]d was admitted in Latent Labor on 04/09/2016. Patient had an uncomplicated labor course as follows:  Membrane Rupture Time/Date: 12:00 PM ,04/08/2016   Intrapartum Procedures: Episiotomy: None [1]                                         Lacerations:  1st degree [2];Perineal [11]  Patient had a delivery of a Viable infant. 04/10/2016  Information for the patient's newborn:  Erica, Barber [[id number]]  Delivery Method: Vaginal, Spontaneous Delivery (Filed from Delivery Summary)    Patient had an uncomplicated postpartum course.  She is ambulating, tolerating a regular diet, passing flatus, and urinating well. Patient is discharged home in stable condition on 04/12/16.    Physical Exam:   Vitals:   04/11/16 0855 04/11/16 1700 04/11/16 1942 04/12/16 0623  BP: (!) 104/52 (!) 141/77 92/62 113/64  Pulse: 78 88 90 75  Resp: 16 18 18 18   Temp: 98.2 F (36.8 C)  98.3 F (36.8 C) 98.3 F (36.8 C)  TempSrc: Oral  Oral Oral  SpO2:      Weight:      Height:       General: alert, cooperative and no distress Lochia: appropriate Uterine Fundus: firm Incision: Healing well with no significant drainage, No significant erythema DVT Evaluation: No evidence of DVT seen on physical exam. Negative Homan's sign. No cords or calf tenderness. No significant calf/ankle edema.  Labs: Lab Results  Component Value Date   WBC 14.7 (H) 04/11/2016   HGB 8.1 (L) 04/11/2016   HCT 23.6 (L) 04/11/2016   MCV 82.5 04/11/2016   PLT 146 (L) 04/11/2016   CMP  Latest Ref Rng & Units 08/18/2015  Glucose 65 - 99 mg/dL 161(W106(H)  BUN 6 - 20 mg/dL 9  Creatinine 9.600.44 - 4.541.00 mg/dL 0.980.75  Sodium 119135 - 147145 mmol/L 137  Potassium 3.5 - 5.1 mmol/L 3.4(L)  Chloride 101 - 111 mmol/L 102  CO2 22 - 32 mmol/L 25  Calcium 8.9 - 10.3 mg/dL 9.3  Total Protein 6.5 - 8.1 g/dL 7.7  Total Bilirubin 0.3 - 1.2 mg/dL 0.9  Alkaline Phos 38 - 126 U/L 45  AST 15 - 41 U/L 16  ALT 14 - 54 U/L 14   Prenatal labs: ABO, Rh: --/--/O POS (09/23 0835) Antibody: NEG (09/23 0835) Rubella:  Immune RPR: Non Reactive (01/31 0950)  HBsAg:   neg HIV: Non Reactive (01/31 0950)  GBS: Positive (08/22 0000)   Discharge instruction: per After Visit Summary and "Baby and Me Booklet".  After Visit Meds:    Medication List    TAKE these medications   ferrous sulfate 325 (65 FE) MG tablet Take 1 tablet (325 mg total) by mouth 2 (two) times daily with a meal.   ibuprofen 600 MG tablet Commonly known as:  ADVIL,MOTRIN Take 1 tablet (600 mg total) by mouth every 6 (six) hours as needed for fever, headache, mild pain, moderate pain or cramping.   PRENATAL GUMMIES/DHA & FA 0.4-32.5 MG Chew Chew 3 each by mouth at bedtime.       Diet:  routine diet  Activity: Advance as tolerated. Pelvic rest for 6 weeks.   Outpatient follow up:6 weeks  Postpartum contraception: Depo Provera  04/12/2016 Sherre ScarletWILLIAMS, Obinna Ehresman, CNM

## 2016-04-11 NOTE — Plan of Care (Signed)
Problem: Bowel/Gastric: Goal: Gastrointestinal status will improve Outcome: Progressing Pt reports last bowel movement was the day she was admitted.  Pt reports passing gas and bowel sounds active.  Pt encouraged to drink lots of fluids and walk around the hallways.

## 2016-04-12 MED ORDER — FERROUS SULFATE 325 (65 FE) MG PO TABS: 325.0000 mg | ORAL_TABLET | Freq: Two times a day (BID) | ORAL | 3 refills | Status: DC

## 2016-04-12 MED ORDER — IBUPROFEN 600 MG PO TABS: 600.0000 mg | ORAL_TABLET | Freq: Four times a day (QID) | ORAL | 2 refills | Status: DC | PRN

## 2016-04-12 NOTE — Plan of Care (Signed)
Problem: Bowel/Gastric: Goal: Gastrointestinal status will improve Outcome: Completed/Met Date Met: 04/12/16 Pt last bowel movement 9/25.  Pt reports feeling constipated.  Ambulation in hallways encouraged.  Apple-prune juice mixed warmed up given.

## 2016-04-12 NOTE — Lactation Note (Addendum)
This note was copied from a baby's chart. Lactation Consultation Note  Baby still sleepy.  Undressed him for feeding and reviewed hand expression. Drops expressed. Attempted football position but baby would not wake for feeding. Placed him STS on mother's chest.  Demonstrated how to use hand pump. Answered basic questions.  Mom encouraged to feed baby 8-12 times/24 hours and with feeding cues.  Reviewed engorgement care and monitoring voids/stools. Recommend parents call LC when baby cues to observed feeding.   Baby latched on L breast.  Baby sleepy at the breast and needed breast compression and stimulation to actively feed.  A few sucks and swallows observed with stimulation. Pacifier use not recommended at this time.  Encouraged mother to be proactive about feedings.     Patient Name: Erica Barber ZOXWR'UToday's Date: 04/12/2016 Reason for consult: Follow-up assessment   Maternal Data    Feeding Feeding Type: Breast Fed Length of feed: 0 min  LATCH Score/Interventions Latch:  (mom instructed to call for next latch)                    Lactation Tools Discussed/Used     Consult Status Consult Status: Follow-up Date: 04/13/16 Follow-up type: In-patient    Dahlia ByesBerkelhammer, Sarath Privott [facility] 04/12/2016, 9:42 AM

## 2016-04-13 ENCOUNTER — Telehealth (HOSPITAL_COMMUNITY): Payer: Self-pay

## 2016-04-13 ENCOUNTER — Encounter (HOSPITAL_COMMUNITY): Payer: Self-pay

## 2016-04-13 NOTE — Telephone Encounter (Signed)
Mom calls with 3 day old baby.  Baby was 8#9oz at birth and down today to 7#13oz.  Mom reports 6 feedings, 2 stools and 3 voids in past 24 hours.  Lc instructed mom to wake baby every  2 1/2 hours for good feedings keeping baby active and swallowing.  Mom to post pump with hand pump or hand expression and supplement EBM back to baby.  Mom to call DR. If baby is not eating well 8-12 times in next 24 hours.

## 2017-07-18 NOTE — L&D Delivery Note (Signed)
Operative Delivery Note At 3:14 AM a viable and healthy female was delivered via Vaginal, Spontaneous.  Presentation: vertex; Position: Left,, Occiput,, Anterior  With rapid descent, head delivered spontaneously. First maneuver: McRoberts   Second maneuver: Suprapubic presssure    Verbal consent: obtained from patient.  APGAR: 7, 9; weight pending.   Placenta status: spontaneous, intact.   Cord: 3 vessel with the following complications: none.    Anesthesia: epidural  Episiotomy:  none Lacerations: none  Suture Repair: n/a Est. Blood Loss (mL):  300  Mom to postpartum.  Baby to Couplet care / Skin to Skin.  Erica Barber is a 22 y.o. female G2P1001 with IUP at 2969w5d admitted for PROM without onset of labor.  She progressed with Pitocin augmentation to complete and pushed x 2 contractions to deliver. Delivery occurred rapidly after pt sitting up for epidural and RN paged CNM quickly to room.  Infant head was crowning upon CNM arrival.  Delivery of head occurred with next contraction.  Pt initially was unable to position legs due to pain and rapid nature of delivery. Attempt to better position pt for delivery.  RN unable to obtain FHR so pt encouraged to push to deliver baby. Dystocia of right fetal shoulder lasting less than 30 seconds.  McRoberts was not fully achieved due to pt positioning in the bed but suprapubic performed and anterior shoulder delivered without difficulty.  Infant slow to transition but responding to stimulation by RN.  Cord clamped and cut and infant with good cry and color so placed on maternal abdomen.  Placenta intact and spontaneous, bleeding minimal.  Intact perineum.  Mom and baby stable prior to transfer to postpartum. She plans on breast and formula feeding. She requests IUD for birth control.   Sharen CounterLisa Leftwich-Kirby 06/10/2018, 3:44 AM

## 2017-10-23 ENCOUNTER — Encounter (HOSPITAL_COMMUNITY): Payer: Self-pay | Admitting: *Deleted

## 2017-10-23 ENCOUNTER — Inpatient Hospital Stay (HOSPITAL_COMMUNITY)
Admission: AD | Admit: 2017-10-23 | Discharge: 2017-10-23 | Disposition: A | Discharge status: Home / Self Care | Payer: Medicaid Other | Source: Ambulatory Visit | Attending: Obstetrics & Gynecology | Admitting: Obstetrics & Gynecology

## 2017-10-23 DIAGNOSIS — O210 Mild hyperemesis gravidarum: Secondary | ICD-10-CM | POA: Insufficient documentation

## 2017-10-23 DIAGNOSIS — O219 Vomiting of pregnancy, unspecified: Secondary | ICD-10-CM | POA: Diagnosis not present

## 2017-10-23 DIAGNOSIS — Z3A09 9 weeks gestation of pregnancy: Secondary | ICD-10-CM | POA: Insufficient documentation

## 2017-10-23 DIAGNOSIS — B9689 Other specified bacterial agents as the cause of diseases classified elsewhere: Secondary | ICD-10-CM | POA: Insufficient documentation

## 2017-10-23 DIAGNOSIS — O23591 Infection of other part of genital tract in pregnancy, first trimester: Secondary | ICD-10-CM | POA: Insufficient documentation

## 2017-10-23 DIAGNOSIS — N760 Acute vaginitis: Secondary | ICD-10-CM

## 2017-10-23 DIAGNOSIS — Z3201 Encounter for pregnancy test, result positive: Secondary | ICD-10-CM

## 2017-10-23 DIAGNOSIS — N898 Other specified noninflammatory disorders of vagina: Secondary | ICD-10-CM

## 2017-10-23 LAB — WET PREP, GENITAL
Sperm: NONE SEEN
Trich, Wet Prep: NONE SEEN
Yeast Wet Prep HPF POC: NONE SEEN

## 2017-10-23 LAB — POCT PREGNANCY, URINE: Preg Test, Ur: POSITIVE — AB

## 2017-10-23 MED ORDER — METRONIDAZOLE 500 MG PO TABS: 500.0000 mg | ORAL_TABLET | Freq: Two times a day (BID) | ORAL | 0 refills | Status: AC

## 2017-10-23 MED ORDER — VITAMIN B-6 50 MG PO TABS: 50.0000 mg | ORAL_TABLET | Freq: Three times a day (TID) | ORAL | 1 refills | Status: DC

## 2017-10-23 MED ORDER — PRENATAL GUMMIES/DHA & FA 0.4-32.5 MG PO CHEW: 1.0000 | CHEWABLE_TABLET | Freq: Every day | ORAL | 3 refills | Status: DC

## 2017-10-23 NOTE — Discharge Instructions (Signed)
- Start taking Vitamin B6 3 times a day to reduced nausea symptoms - If symptoms persist, you can get Doxylamine (Unisom is the brand name, but you can get the generic), and take at bedtime in addition to the Vitamin B6.   Morning Sickness Morning sickness is when you feel sick to your stomach (nauseous) during pregnancy. You may feel sick to your stomach and throw up (vomit). You may feel sick in the morning, but you can feel this way any time of day. Some women feel very sick to their stomach and cannot stop throwing up (hyperemesis gravidarum). Follow these instructions at home:  Only take medicines as told by your doctor.  Take multivitamins as told by your doctor. Taking multivitamins before getting pregnant can stop or lessen the harshness of morning sickness.  Eat dry toast or unsalted crackers before getting out of bed.  Eat 5 to 6 small meals a day.  Eat dry and bland foods like rice and baked potatoes.  Do not drink liquids with meals. Drink between meals.  Do not eat greasy, fatty, or spicy foods.  Have someone cook for you if the smell of food causes you to feel sick or throw up.  If you feel sick to your stomach after taking prenatal vitamins, take them at night or with a snack.  Eat protein when you need a snack (nuts, yogurt, cheese).  Eat unsweetened gelatins for dessert.  Wear a bracelet used for sea sickness (acupressure wristband).  Go to a doctor that puts thin needles into certain body points (acupuncture) to improve how you feel.  Do not smoke.  Use a humidifier to keep the air in your house free of odors.  Get lots of fresh air. Contact a doctor if:  You need medicine to feel better.  You feel dizzy or lightheaded.  You are losing weight. Get help right away if:  You feel very sick to your stomach and cannot stop throwing up.  You pass out (faint). This information is not intended to replace advice given to you by your health care provider.  Make sure you discuss any questions you have with your health care provider. Document Released: 08/11/2004 Document Revised: 12/10/2015 Document Reviewed: 12/19/2012 Elsevier Interactive Patient Education  2017 [employer]    Bacterial Vaginosis Bacterial vaginosis is an infection of the vagina. It happens when too many germs (bacteria) grow in the vagina. This infection puts you at risk for infections from sex (STIs). Treating this infection can lower your risk for some STIs. You should also treat this if you are pregnant. It can cause your baby to be born early. Follow these instructions at home: Medicines  Take over-the-counter and prescription medicines only as told by your doctor.  Take or use your antibiotic medicine as told by your doctor. Do not stop taking or using it even if you start to feel better. General instructions  If you your sexual partner is a woman, tell her that you have this infection. She needs to get treatment if she has symptoms. If you have a female partner, he does not need to be treated.  During treatment: ? Avoid sex. ? Do not douche. ? Avoid alcohol as told. ? Avoid breastfeeding as told.  Drink enough fluid to keep your pee (urine) clear or pale yellow.  Keep your vagina and butt (rectum) clean. ? Wash the area with warm water every day. ? Wipe from front to back after you use the toilet.  Keep  all follow-up visits as told by your doctor. This is important. Preventing this condition  Do not douche.  Use only warm water to wash around your vagina.  Use protection when you have sex. This includes: ? Latex condoms. ? Dental dams.  Limit how many people you have sex with. It is best to only have sex with the same person (be monogamous).  Get tested for STIs. Have your partner get tested.  Wear underwear that is cotton or lined with cotton.  Avoid tight pants and pantyhose. This is most important in summer.  Do not use any products that  have nicotine or tobacco in them. These include cigarettes and e-cigarettes. If you need help quitting, ask your doctor.  Do not use illegal drugs.  Limit how much alcohol you drink. Contact a doctor if:  Your symptoms do not get better, even after you are treated.  You have more discharge or pain when you pee (urinate).  You have a fever.  You have pain in your belly (abdomen).  You have pain with sex.  Your bleed from your vagina between periods. Summary  This infection happens when too many germs (bacteria) grow in the vagina.  Treating this condition can lower your risk for some infections from sex (STIs).  You should also treat this if you are pregnant. It can cause early (premature) birth.  Do not stop taking or using your antibiotic medicine even if you start to feel better. This information is not intended to replace advice given to you by your health care provider. Make sure you discuss any questions you have with your health care provider. Document Released: 04/12/2008 Document Revised: 03/19/2016 Document Reviewed: 03/19/2016 Elsevier Interactive Patient Education  2017 [employer].

## 2017-10-23 NOTE — MAU Note (Signed)
Present to mau after feeling sick.  Missed period x 1 month. Denies vaginal bleeding, + increased thick white discharge, last intercourse x 1 week. Denies new sexual partner

## 2017-10-23 NOTE — MAU Provider Note (Signed)
History  CSN: [id number] Arrival date and time: 10/23/17 1952  First Provider Initiated Contact with Patient 10/23/17 2221      Chief Complaint  Patient presents with  . Nausea    HPI: Erica Barber is a 22 y.o. G1P1001 who presents to maternity admissions reporting "feeling sick" for about a week. She reports nausea most days for the last week, and decreased appetite. Denies vomiting, diarrhea, abdominal pain, fever, or chills. Her LMP was 08/20/17. Denies vaginal bleeding/spotting, abdominal or pelvic pain. Reports thick white vaginal discharge for the last few days with no odor or itching. No new sex partners.   Denies any particular worse symptoms tonight. She was feeling nausea while at work, but now improved.   OB History  Gravida Para Term Preterm AB Living  1 1 1     1   SAB TAB Ectopic Multiple Live Births        0 1    # Outcome Date GA Lbr Len/2nd Weight Sex Delivery Anes PTL Lv  1 Term 04/10/16 [redacted]w[redacted]d 11:16 / 02:19 8 lb 9.9 oz (3.91 kg) M Vag-Spont EPI  LIV   Past Medical History:  Diagnosis Date  . Asthma    Past Surgical History:  Procedure Laterality Date  . NO PAST SURGERIES     Social History   Socioeconomic History  . Marital status: Single    Spouse name: Not on file  . Number of children: Not on file  . Years of education: Not on file  . Highest education level: Not on file  Occupational History  . Not on file  Social Needs  . Financial resource strain: Not on file  . Food insecurity:    Worry: Not on file    Inability: Not on file  . Transportation needs:    Medical: Not on file    Non-medical: Not on file  Tobacco Use  . Smoking status: Never Smoker  . Smokeless tobacco: Never Used  Substance and Sexual Activity  . Alcohol use: No  . Drug use: Yes    Types: Marijuana  . Sexual activity: Not on file  Lifestyle  . Physical activity:    Days per week: Not on file    Minutes per session: Not on file  . Stress: Not on file  Relationships   . Social connections:    Talks on phone: Not on file    Gets together: Not on file    Attends religious service: Not on file    Active member of club or organization: Not on file    Attends meetings of clubs or organizations: Not on file    Relationship status: Not on file  . Intimate partner violence:    Fear of current or ex partner: Not on file    Emotionally abused: Not on file    Physically abused: Not on file    Forced sexual activity: Not on file  Other Topics Concern  . Not on file  Social History Narrative  . Not on file   No Known Allergies  Medications Prior to Admission  Medication Sig Dispense Refill Last Dose  . ferrous sulfate 325 (65 FE) MG tablet Take 1 tablet (325 mg total) by mouth 2 (two) times daily with a meal. 90 tablet 3   . ibuprofen (ADVIL,MOTRIN) 600 MG tablet Take 1 tablet (600 mg total) by mouth every 6 (six) hours as needed for fever, headache, mild pain, moderate pain or cramping. 30 tablet 2   .  Prenatal MV-Min-FA-Omega-3 (PRENATAL GUMMIES/DHA & FA) 0.4-32.5 MG CHEW Chew 3 each by mouth at bedtime.   Past Week at Unknown time    I have reviewed patient's Past Medical Hx, Surgical Hx, Family Hx, Social Hx, medications and allergies.   Review of Systems: Negative except for what is mentioned in HPI.  Physical Exam   Blood pressure (!) 81/65, temperature 98.1 F (36.7 C), temperature source Oral, resp. rate 18, height 5\' 3"  (1.6 m), weight 220 lb (99.8 kg), last menstrual period 08/20/2017, unknown if currently breastfeeding.  Constitutional: Well-developed, well-nourished female in no acute distress.  HENT: [place]/AT, normal oropharynx mucosa. MMM Eyes: normal conjunctivae, no scleral icterus Cardiovascular: normal rate, regular rhythm Respiratory: normal effort GI: Abd soft, non-tender, non-distended, normoactive bowel sounds. Pelvic: NEFG. Normal vaginal mucosa without lesions; cervix pink, visually closed, without lesion; moderate whitish  discharge. Cervix closed/long, firm, neg CMT, uterus nontender, nonenlarged, adnexa without tenderness, enlargement, or mass, but exam limited by body habitus. MSK: Extremities nontender, no edema Neurologic: Alert and oriented x 4. Psych: Normal mood and affect Skin: warm and dry    MAU Course/MDM:   Nursing notes and VS reviewed. Patient seen and examined, as noted above.   UPT positive.  Wet prep, GC/C probe collected to eval vaginal discharge.  Wet prep c/w BV.  Assessment and Plan  Assessment: 22 y.o. now G2P1001 presenting with 1-week hx of nausea and new vaginal discharge. Has positive UPT with no vaginal bleeding or abdominal pain. GA 3745w0d by LMP w/ EDD 05/28/18.   1. Nausea/vomiting in pregnancy   2. Positive pregnancy test   3. Vaginal discharge   4. BV (bacterial vaginosis)     Plan: --Discussed management of nausea and vomiting in pregnancy, including small frequent meals. Start scheduled Vitamin B6. Add Unisom if need unrelieved symptoms. - Rx for Flagyl 500 mg BID x 7 days for BV. --[facility]  --Discussed precautions to seek care if vaginal bleeding or abdominal/pelvic pain --Discharge home in stable condition.    Vonn Sliger, Kandra NicolasJulie P, MD 10/23/2017 10:31 PM

## 2017-10-24 LAB — GC/CHLAMYDIA PROBE AMP (~~LOC~~) NOT AT ARMC
CHLAMYDIA, DNA PROBE: NEGATIVE
NEISSERIA GONORRHEA: NEGATIVE

## 2018-05-11 LAB — OB RESULTS CONSOLE GBS: GBS: POSITIVE

## 2018-05-22 LAB — OB RESULTS CONSOLE GBS: STREP GROUP B AG: POSITIVE

## 2018-06-09 ENCOUNTER — Inpatient Hospital Stay (HOSPITAL_COMMUNITY)
Admission: AD | Admit: 2018-06-09 | Discharge: 2018-06-12 | DRG: 807 | Disposition: A | Discharge status: Home / Self Care | Payer: Medicaid Other | Source: Ambulatory Visit | Attending: Obstetrics and Gynecology | Admitting: Obstetrics and Gynecology

## 2018-06-09 ENCOUNTER — Encounter (HOSPITAL_COMMUNITY): Payer: Self-pay | Admitting: *Deleted

## 2018-06-09 DIAGNOSIS — O99824 Streptococcus B carrier state complicating childbirth: Secondary | ICD-10-CM | POA: Diagnosis present

## 2018-06-09 DIAGNOSIS — O9902 Anemia complicating childbirth: Secondary | ICD-10-CM | POA: Diagnosis present

## 2018-06-09 DIAGNOSIS — Z8249 Family history of ischemic heart disease and other diseases of the circulatory system: Secondary | ICD-10-CM

## 2018-06-09 DIAGNOSIS — O4202 Full-term premature rupture of membranes, onset of labor within 24 hours of rupture: Secondary | ICD-10-CM | POA: Diagnosis not present

## 2018-06-09 DIAGNOSIS — O4292 Full-term premature rupture of membranes, unspecified as to length of time between rupture and onset of labor: Secondary | ICD-10-CM | POA: Diagnosis present

## 2018-06-09 DIAGNOSIS — Z3A40 40 weeks gestation of pregnancy: Secondary | ICD-10-CM

## 2018-06-09 DIAGNOSIS — O480 Post-term pregnancy: Secondary | ICD-10-CM | POA: Diagnosis present

## 2018-06-09 LAB — POCT FERN TEST: POCT FERN TEST: POSITIVE

## 2018-06-09 LAB — TYPE AND SCREEN
ABO/RH(D): O POS
Antibody Screen: NEGATIVE

## 2018-06-09 LAB — CBC
HEMATOCRIT: 34.8 % — AB (ref 36.0–46.0)
Hemoglobin: 10.7 g/dL — ABNORMAL LOW (ref 12.0–15.0)
MCH: 26 pg (ref 26.0–34.0)
MCHC: 30.7 g/dL (ref 30.0–36.0)
MCV: 84.7 fL (ref 80.0–100.0)
PLATELETS: 132 10*3/uL — AB (ref 150–400)
RBC: 4.11 MIL/uL (ref 3.87–5.11)
RDW: 15.4 % (ref 11.5–15.5)
WBC: 7.5 10*3/uL (ref 4.0–10.5)
nRBC: 0 % (ref 0.0–0.2)

## 2018-06-09 MED ORDER — OXYCODONE-ACETAMINOPHEN 5-325 MG PO TABS: 2.0000 | ORAL_TABLET | ORAL | Status: DC | PRN

## 2018-06-09 MED ORDER — LIDOCAINE HCL (PF) 1 % IJ SOLN
30.0000 mL | INTRAMUSCULAR | Status: DC | PRN
  Administered 2018-06-10: 30 mL via SUBCUTANEOUS
  Filled 2018-06-09: qty 30

## 2018-06-09 MED ORDER — LACTATED RINGERS IV SOLN: 500.0000 mL | Freq: Once | INTRAVENOUS | Status: DC

## 2018-06-09 MED ORDER — SOD CITRATE-CITRIC ACID 500-334 MG/5ML PO SOLN: 30.0000 mL | ORAL | Status: DC | PRN

## 2018-06-09 MED ORDER — FENTANYL CITRATE (PF) 100 MCG/2ML IJ SOLN
100.0000 ug | INTRAMUSCULAR | Status: DC | PRN
  Administered 2018-06-10 (×2): 100 ug via INTRAVENOUS
  Filled 2018-06-09 (×2): qty 2

## 2018-06-09 MED ORDER — EPHEDRINE 5 MG/ML INJ
10.0000 mg | INTRAVENOUS | Status: DC | PRN
  Filled 2018-06-09: qty 2

## 2018-06-09 MED ORDER — TERBUTALINE SULFATE 1 MG/ML IJ SOLN
0.2500 mg | Freq: Once | INTRAMUSCULAR | Status: DC | PRN
  Filled 2018-06-09: qty 1

## 2018-06-09 MED ORDER — OXYTOCIN BOLUS FROM INFUSION
500.0000 mL | Freq: Once | INTRAVENOUS | Status: AC
  Administered 2018-06-10: 500 mL via INTRAVENOUS

## 2018-06-09 MED ORDER — PHENYLEPHRINE 40 MCG/ML (10ML) SYRINGE FOR IV PUSH (FOR BLOOD PRESSURE SUPPORT)
80.0000 ug | PREFILLED_SYRINGE | INTRAVENOUS | Status: DC | PRN
  Filled 2018-06-09: qty 10
  Filled 2018-06-09: qty 5

## 2018-06-09 MED ORDER — LACTATED RINGERS IV SOLN
INTRAVENOUS | Status: DC
  Administered 2018-06-09 (×2): via INTRAVENOUS

## 2018-06-09 MED ORDER — OXYTOCIN 40 UNITS IN LACTATED RINGERS INFUSION - SIMPLE MED
1.0000 m[IU]/min | INTRAVENOUS | Status: DC
  Administered 2018-06-09: 2 m[IU]/min via INTRAVENOUS

## 2018-06-09 MED ORDER — SODIUM CHLORIDE 0.9 % IV SOLN
5.0000 10*6.[IU] | Freq: Once | INTRAVENOUS | Status: AC
  Administered 2018-06-09: 5 10*6.[IU] via INTRAVENOUS
  Filled 2018-06-09: qty 5

## 2018-06-09 MED ORDER — ONDANSETRON HCL 4 MG/2ML IJ SOLN: 4.0000 mg | Freq: Four times a day (QID) | INTRAMUSCULAR | Status: DC | PRN

## 2018-06-09 MED ORDER — DIPHENHYDRAMINE HCL 50 MG/ML IJ SOLN: 12.5000 mg | INTRAMUSCULAR | Status: DC | PRN

## 2018-06-09 MED ORDER — PHENYLEPHRINE 40 MCG/ML (10ML) SYRINGE FOR IV PUSH (FOR BLOOD PRESSURE SUPPORT)
80.0000 ug | PREFILLED_SYRINGE | INTRAVENOUS | Status: DC | PRN
  Filled 2018-06-09: qty 5

## 2018-06-09 MED ORDER — ACETAMINOPHEN 325 MG PO TABS: 650.0000 mg | ORAL_TABLET | ORAL | Status: DC | PRN

## 2018-06-09 MED ORDER — OXYTOCIN 40 UNITS IN LACTATED RINGERS INFUSION - SIMPLE MED
2.5000 [IU]/h | INTRAVENOUS | Status: DC
  Filled 2018-06-09: qty 1000

## 2018-06-09 MED ORDER — OXYCODONE-ACETAMINOPHEN 5-325 MG PO TABS: 1.0000 | ORAL_TABLET | ORAL | Status: DC | PRN

## 2018-06-09 MED ORDER — LACTATED RINGERS IV SOLN
500.0000 mL | INTRAVENOUS | Status: DC | PRN
  Administered 2018-06-10: 500 mL via INTRAVENOUS

## 2018-06-09 MED ORDER — PENICILLIN G 3 MILLION UNITS IVPB - SIMPLE MED
3.0000 10*6.[IU] | INTRAVENOUS | Status: DC
  Administered 2018-06-10: 3 10*6.[IU] via INTRAVENOUS
  Filled 2018-06-09 (×3): qty 100

## 2018-06-09 MED ORDER — FENTANYL 2.5 MCG/ML BUPIVACAINE 1/10 % EPIDURAL INFUSION (WH - ANES)
14.0000 mL/h | INTRAMUSCULAR | Status: DC | PRN
  Administered 2018-06-10: 14 mL/h via EPIDURAL
  Filled 2018-06-09: qty 100

## 2018-06-09 NOTE — H&P (Signed)
LABOR AND DELIVERY ADMISSION HISTORY AND PHYSICAL NOTE  Erica Barber is a 22 y.o. female G2P1001 with IUP at [redacted]w[redacted]d by 14 wk sono presenting for PROM. ROM at 1430 with clear fluid. Fern test positive in MAU. Contractions very irregular, not strong.  She reports positive fetal movement. She denies vaginal bleeding.  35wk u/s- female, anterior placenta, 55%ile with AC 87%.    Prenatal History/Complications: PNC at [facility]  Pregnancy complications:  - Abnormal PAP   Past Medical History: Past Medical History:  Diagnosis Date  . Asthma     Past Surgical History: Past Surgical History:  Procedure Laterality Date  . NO PAST SURGERIES      Obstetrical History: OB History    Gravida  2   Para  1   Term  1   Preterm      AB      Living  1     SAB      TAB      Ectopic      Multiple  0   Live Births  1           Social History: Social History   Socioeconomic History  . Marital status: Single    Spouse name: Not on file  . Number of children: 1  . Years of education: Not on file  . Highest education level: High school graduate  Occupational History  . Not on file  Social Needs  . Financial resource strain: Not hard at all  . Food insecurity:    Worry: Never true    Inability: Never true  . Transportation needs:    Medical: No    Non-medical: No  Tobacco Use  . Smoking status: Never Smoker  . Smokeless tobacco: Never Used  Substance and Sexual Activity  . Alcohol use: No  . Drug use: Yes    Types: Marijuana    Comment: not since she has been pregnant  . Sexual activity: Yes  Lifestyle  . Physical activity:    Days per week: Not on file    Minutes per session: Not on file  . Stress: Not at all  Relationships  . Social connections:    Talks on phone: Not on file    Gets together: Not on file    Attends religious service: Not on file    Active member of club or organization: Not on file    Attends meetings of clubs or  organizations: Not on file    Relationship status: Not on file  Other Topics Concern  . Not on file  Social History Narrative  . Not on file    Family History: Family History  Problem Relation Age of Onset  . Hypertension Father   . Hypertension Paternal Grandmother     Allergies: No Known Allergies  Medications Prior to Admission  Medication Sig Dispense Refill Last Dose  . ferrous sulfate 325 (65 FE) MG tablet Take 1 tablet (325 mg total) by mouth 2 (two) times daily with a meal. 90 tablet 3   . Prenatal MV-Min-FA-Omega-3 (PRENATAL GUMMIES/DHA & FA) 0.4-32.5 MG CHEW Chew 1 each by mouth at bedtime. 90 tablet 3   . pyridOXINE (VITAMIN B-6) 50 MG tablet Take 1 tablet (50 mg total) by mouth 3 (three) times daily. 90 tablet 1      Review of Systems  All systems reviewed and negative except as stated in HPI  Physical Exam Blood pressure 119/74, pulse 85, temperature 98.6 F (37  C), temperature source Oral, resp. rate 16, weight 97.6 kg, last menstrual period 08/20/2017, SpO2 100 %, unknown if currently breastfeeding. General appearance: alert, oriented, NAD Lungs: normal respiratory effort Heart: regular rate Abdomen: soft, non-tender; gravid, FH appropriate for GA Extremities: No calf swelling or tenderness Presentation: cephalic by bedside sono  Fetal monitoring: 145 bpm, moderate variability, +acels, no decels  Uterine activity: Occasional     Prenatal labs: ABO, Rh: --/--/O POS (11/23 1822) Antibody: NEG (11/23 1822) Rubella:  Unknown RPR:   Pending  HBsAg:   Pending  HIV:   Pending  GC/Chlamydia: Pending  GBS: Positive (11/05 0000)  2-hr GTT: Normal  Genetic screening:  Normal  Anatomy US: Normal   Prenatal Transfer Tool  Maternal Diabetes: No Genetic Screening: Normal Maternal Ultrasounds/Referrals: Normal Fetal Ultrasounds or other Referrals:  None Maternal Substance Abuse:  No Significant Maternal Medications:  None Significant Maternal Lab Results:  Lab values include: Group B Strep positive  Results for orders placed or performed during the hospital encounter of 06/09/18 (from the past 24 hour(s))  POCT fern test   Collection Time: 06/09/18  5:58 PM  Result Value Ref Range   POCT Fern Test Positive = ruptured amniotic membanes   CBC   Collection Time: 06/09/18  6:22 PM  Result Value Ref Range   WBC 7.5 4.0 - 10.5 K/uL   RBC 4.11 3.87 - 5.11 MIL/uL   Hemoglobin 10.7 (L) 12.0 - 15.0 g/dL   HCT 81.134.8 (L) 91.436.0 - 78.246.0 %   MCV 84.7 80.0 - 100.0 fL   MCH 26.0 26.0 - 34.0 pg   MCHC 30.7 30.0 - 36.0 g/dL   RDW 95.615.4 21.311.5 - 08.615.5 %   Platelets 132 (L) 150 - 400 K/uL   nRBC 0.0 0.0 - 0.2 %  Type and screen [facility]   Collection Time: 06/09/18  6:22 PM  Result Value Ref Range   ABO/RH(D) O POS    Antibody Screen NEG    Sample Expiration      06/12/2018 Performed at [facility], [address] [place], [place] [address]     Patient Active Problem List   Diagnosis Date Noted  . Normal labor 06/09/2018  . Group beta Strep positive 04/11/2016  . Anemia 04/11/2016  . Vaginal delivery 04/10/2016  . Asthma 10/16/2015    Assessment: Susette RacerShataura S Salim is a 22 y.o. G2P1001 at 6846w4d here for PROM.   #Labor: Discussed with patient that I would recommend augmentation as she has been ruptured for >5 hours without any contractions. Will plan to start Pitocin and check cervix after patient eats dinner. Last cervical exam in office was 3cm. Will obtain some prenatal labs as unavailable for review on EMR and not able to obtain prenatal records.  #Pain: Plans for epidural  #FWB: Cat I  #ID:  GBS+, PCN  #MOF: Breast and bottle  #MOC:IUD  #Circ:  N/A   De HollingsheadCatherine L Daley Gosse 06/09/2018, 7:25 PM

## 2018-06-09 NOTE — MAU Note (Signed)
Erica Barber is a 22 y.o. at 7780w4d here in MAU reporting: +LOF clear. Initial gush. Soaked through clothes Onset of complaint: 230pm Pain score: denies Endorses last VE on Tuesday was 3cm. Denies any complications with this pregnancy. Previous vaginal delivery.  Vitals:   06/09/18 1722  BP: (!) 106/53  Pulse: 89  Resp: 18  Temp: 98.2 F (36.8 C)  SpO2: 100%     FHT:+FM

## 2018-06-09 NOTE — Progress Notes (Signed)
Erica Barber is a 22 y.o. G2P1001 at 5035w4d by LMP admitted for rupture of membranes on 06/09/18 at 1430 without onset of active labor.  Subjective: Pt comfortable, denies feeling contractions, is feeling normal fetal movement.  Family members in room for support.  Objective: BP 137/80   Pulse 90   Temp 98.6 F (37 C) (Oral)   Resp 16   Wt 97.6 kg   LMP 08/20/2017 (Approximate)   SpO2 100% Comment: ra  BMI 38.10 kg/m  No intake/output data recorded. No intake/output data recorded.  FHT:  FHR: 145 bpm, variability: moderate,  accelerations:  Present,  decelerations:  Absent UC:   Rare, mild to palpation SVE:   Dilation: 4 Effacement (%): 50 Station: -3 Exam by:: Sharen CounterLisa, Leftwich-Kirby, CNM  Labs: Lab Results  Component Value Date   WBC 7.5 06/09/2018   HGB 10.7 (L) 06/09/2018   HCT 34.8 (L) 06/09/2018   MCV 84.7 06/09/2018   PLT 132 (L) 06/09/2018    Assessment / Plan: PROM without onset of active labor at 6635w4d  Labor: Will augment with Pitocin, discussed plan with pt who agrees.  Start Pitocin at 2 milliunits, increase by 2 per protocol until adequate labor.  Pt may have Fentanyl 100 mcg Q 1 hour or epidural as desired. Preeclampsia:  n/a Fetal Wellbeing:  Category I Pain Control:  Labor support without medications I/D:  GBS positive on PCN Anticipated MOD:  NSVD  Sharen CounterLisa Leftwich-Kirby 06/09/2018, 8:52 PM

## 2018-06-09 NOTE — Progress Notes (Signed)
Pt informed that the ultrasound is considered a limited OB ultrasound and is not intended to be a complete ultrasound exam.  Patient also informed that the ultrasound is not being completed with the intent of assessing for fetal or placental anomalies or any pelvic abnormalities.  Explained that the purpose of today's ultrasound is to assess for  presentation.  Patient acknowledges the purpose of the exam and the limitations of the study.    VERTEX   Erica ShellerHeather Cortavius Barber 6:03 PM 06/09/18

## 2018-06-10 ENCOUNTER — Encounter (HOSPITAL_COMMUNITY): Payer: Self-pay | Admitting: General Practice

## 2018-06-10 ENCOUNTER — Other Ambulatory Visit: Payer: Self-pay

## 2018-06-10 ENCOUNTER — Inpatient Hospital Stay (HOSPITAL_COMMUNITY): Payer: Medicaid Other | Admitting: Anesthesiology

## 2018-06-10 DIAGNOSIS — O4202 Full-term premature rupture of membranes, onset of labor within 24 hours of rupture: Secondary | ICD-10-CM

## 2018-06-10 DIAGNOSIS — O480 Post-term pregnancy: Secondary | ICD-10-CM

## 2018-06-10 DIAGNOSIS — Z3A40 40 weeks gestation of pregnancy: Secondary | ICD-10-CM

## 2018-06-10 DIAGNOSIS — O99824 Streptococcus B carrier state complicating childbirth: Secondary | ICD-10-CM

## 2018-06-10 LAB — CBC
HCT: 36 % (ref 36.0–46.0)
HCT: 33.9 % — ABNORMAL LOW (ref 36.0–46.0)
HEMOGLOBIN: 10.5 g/dL — AB (ref 12.0–15.0)
Hemoglobin: 11.4 g/dL — ABNORMAL LOW (ref 12.0–15.0)
MCH: 26.8 pg (ref 26.0–34.0)
MCH: 26.2 pg (ref 26.0–34.0)
MCHC: 31.7 g/dL (ref 30.0–36.0)
MCHC: 31 g/dL (ref 30.0–36.0)
MCV: 84.5 fL (ref 80.0–100.0)
MCV: 84.5 fL (ref 80.0–100.0)
PLATELETS: 122 10*3/uL — AB (ref 150–400)
Platelets: 134 10*3/uL — ABNORMAL LOW (ref 150–400)
RBC: 4.26 MIL/uL (ref 3.87–5.11)
RBC: 4.01 MIL/uL (ref 3.87–5.11)
RDW: 15.6 % — AB (ref 11.5–15.5)
RDW: 15.5 % (ref 11.5–15.5)
WBC: 7.9 10*3/uL (ref 4.0–10.5)
WBC: 13.4 10*3/uL — AB (ref 4.0–10.5)
nRBC: 0 % (ref 0.0–0.2)
nRBC: 0 % (ref 0.0–0.2)

## 2018-06-10 LAB — RPR: RPR: NONREACTIVE

## 2018-06-10 LAB — HEPATITIS B SURFACE ANTIGEN: Hepatitis B Surface Ag: NEGATIVE

## 2018-06-10 LAB — HIV ANTIBODY (ROUTINE TESTING W REFLEX): HIV SCREEN 4TH GENERATION: NONREACTIVE

## 2018-06-10 MED ORDER — TETANUS-DIPHTH-ACELL PERTUSSIS 5-2.5-18.5 LF-MCG/0.5 IM SUSP: 0.5000 mL | Freq: Once | INTRAMUSCULAR | Status: DC

## 2018-06-10 MED ORDER — SIMETHICONE 80 MG PO CHEW: 80.0000 mg | CHEWABLE_TABLET | ORAL | Status: DC | PRN

## 2018-06-10 MED ORDER — FERROUS SULFATE 325 (65 FE) MG PO TABS
325.0000 mg | ORAL_TABLET | Freq: Every day | ORAL | Status: DC
  Administered 2018-06-10 – 2018-06-11 (×2): 325 mg via ORAL
  Filled 2018-06-10 (×3): qty 1

## 2018-06-10 MED ORDER — SENNOSIDES-DOCUSATE SODIUM 8.6-50 MG PO TABS
2.0000 | ORAL_TABLET | ORAL | Status: DC
  Administered 2018-06-10 – 2018-06-11 (×2): 2 via ORAL
  Filled 2018-06-10 (×2): qty 2

## 2018-06-10 MED ORDER — ONDANSETRON HCL 4 MG/2ML IJ SOLN: 4.0000 mg | INTRAMUSCULAR | Status: DC | PRN

## 2018-06-10 MED ORDER — DIBUCAINE 1 % RE OINT: 1.0000 "application " | TOPICAL_OINTMENT | RECTAL | Status: DC | PRN

## 2018-06-10 MED ORDER — IBUPROFEN 600 MG PO TABS
600.0000 mg | ORAL_TABLET | Freq: Four times a day (QID) | ORAL | Status: DC
  Administered 2018-06-10 – 2018-06-12 (×10): 600 mg via ORAL
  Filled 2018-06-10 (×10): qty 1

## 2018-06-10 MED ORDER — MISOPROSTOL 200 MCG PO TABS
ORAL_TABLET | ORAL | Status: AC
  Filled 2018-06-10: qty 4

## 2018-06-10 MED ORDER — WITCH HAZEL-GLYCERIN EX PADS: 1.0000 "application " | MEDICATED_PAD | CUTANEOUS | Status: DC | PRN

## 2018-06-10 MED ORDER — MISOPROSTOL 200 MCG PO TABS
800.0000 ug | ORAL_TABLET | Freq: Once | ORAL | Status: AC
  Administered 2018-06-10: 800 ug via BUCCAL

## 2018-06-10 MED ORDER — ACETAMINOPHEN 325 MG PO TABS: 650.0000 mg | ORAL_TABLET | ORAL | Status: DC | PRN

## 2018-06-10 MED ORDER — DIPHENHYDRAMINE HCL 25 MG PO CAPS: 25.0000 mg | ORAL_CAPSULE | Freq: Four times a day (QID) | ORAL | Status: DC | PRN

## 2018-06-10 MED ORDER — ZOLPIDEM TARTRATE 5 MG PO TABS: 5.0000 mg | ORAL_TABLET | Freq: Every evening | ORAL | Status: DC | PRN

## 2018-06-10 MED ORDER — EPHEDRINE 5 MG/ML INJ: 10.0000 mg | INTRAVENOUS | Status: DC | PRN

## 2018-06-10 MED ORDER — PRENATAL MULTIVITAMIN CH
1.0000 | ORAL_TABLET | Freq: Every day | ORAL | Status: DC
  Administered 2018-06-10 – 2018-06-12 (×3): 1 via ORAL
  Filled 2018-06-10 (×3): qty 1

## 2018-06-10 MED ORDER — COCONUT OIL OIL: 1.0000 "application " | TOPICAL_OIL | Status: DC | PRN

## 2018-06-10 MED ORDER — ONDANSETRON HCL 4 MG PO TABS: 4.0000 mg | ORAL_TABLET | ORAL | Status: DC | PRN

## 2018-06-10 MED ORDER — LACTATED RINGERS IV SOLN: 500.0000 mL | Freq: Once | INTRAVENOUS | Status: DC

## 2018-06-10 MED ORDER — PHENYLEPHRINE 40 MCG/ML (10ML) SYRINGE FOR IV PUSH (FOR BLOOD PRESSURE SUPPORT): 80.0000 ug | PREFILLED_SYRINGE | INTRAVENOUS | Status: DC | PRN

## 2018-06-10 MED ORDER — MISOPROSTOL 200 MCG PO TABS: 800.0000 ug | ORAL_TABLET | Freq: Once | ORAL | Status: DC

## 2018-06-10 MED ORDER — BENZOCAINE-MENTHOL 20-0.5 % EX AERO: 1.0000 "application " | INHALATION_SPRAY | CUTANEOUS | Status: DC | PRN

## 2018-06-10 MED ORDER — LIDOCAINE HCL (PF) 1 % IJ SOLN
INTRAMUSCULAR | Status: DC | PRN
  Administered 2018-06-10: 5 mL via EPIDURAL

## 2018-06-10 NOTE — Progress Notes (Signed)
Erica Barber is a 22 y.o. G2P1001 at 10539w5d admitted for PROM  Subjective: Pt breathing with contractions, requiring IV pain medication. She is unsure if she wants epidural.  Family in room for support.  Objective: BP 127/71   Pulse 83   Temp 98.1 F (36.7 C) (Oral)   Resp 18   Wt 97.6 kg   LMP 08/20/2017 (Approximate)   SpO2 97%   BMI 38.10 kg/m  No intake/output data recorded. No intake/output data recorded.  FHT:  FHR: 145 bpm, variability: moderate,  accelerations:  Present,  decelerations:  Present intermittent variables UC:   regular, every 4-5 minutes SVE:   Dilation: 6 Effacement (%): 80 Station: -2 Exam by:: Erica Barber, CNM  Labs: Lab Results  Component Value Date   WBC 7.5 06/09/2018   HGB 10.7 (L) 06/09/2018   HCT 34.8 (L) 06/09/2018   MCV 84.7 06/09/2018   PLT 132 (L) 06/09/2018    Assessment / Plan: Augmentation of labor, progressing well  Labor: Progressing normally Preeclampsia:  n/a Fetal Wellbeing:  Category II Pain Control:  IV pain meds I/D:  GBS positive on PCN Anticipated MOD:  NSVD  Erica Barber 06/10/2018, 2:09 AM

## 2018-06-10 NOTE — Anesthesia Preprocedure Evaluation (Signed)
Anesthesia Evaluation  Patient identified by MRN, date of birth, ID band Patient awake    Reviewed: Allergy & Precautions, NPO status , Patient's Chart, lab work & pertinent test results  Airway Mallampati: II  TM Distance: >3 FB Neck ROM: Full    Dental no notable dental hx. (+) Teeth Intact   Pulmonary asthma ,    Pulmonary exam normal breath sounds clear to auscultation       Cardiovascular negative cardio ROS Normal cardiovascular exam Rhythm:Regular Rate:Normal     Neuro/Psych negative neurological ROS  negative psych ROS   GI/Hepatic Neg liver ROS,   Endo/Other  negative endocrine ROS  Renal/GU negative Renal ROS     Musculoskeletal negative musculoskeletal ROS (+)   Abdominal (+) + obese,   Peds  Hematology  (+) Blood dyscrasia, anemia ,   Anesthesia Other Findings   Reproductive/Obstetrics (+) Pregnancy                             Lab Results  Component Value Date   WBC 7.9 06/10/2018   HGB 11.4 (L) 06/10/2018   HCT 36.0 06/10/2018   MCV 84.5 06/10/2018   PLT 134 (L) 06/10/2018    Anesthesia Physical Anesthesia Plan  ASA: III  Anesthesia Plan: Epidural   Post-op Pain Management:    Induction:   PONV Risk Score and Plan:   Airway Management Planned:   Additional Equipment:   Intra-op Plan:   Post-operative Plan:   Informed Consent: I have reviewed the patients History and Physical, chart, labs and discussed the procedure including the risks, benefits and alternatives for the proposed anesthesia with the patient or authorized representative who has indicated his/her understanding and acceptance.     Plan Discussed with:   Anesthesia Plan Comments:         Anesthesia Quick Evaluation

## 2018-06-10 NOTE — Anesthesia Procedure Notes (Addendum)
Epidural Patient location during procedure: OB Start time: 06/10/2018 2:55 AM End time: 06/10/2018 3:14 AM  Staffing Anesthesiologist: Trevor IhaHouser, Stephen A, MD Performed: anesthesiologist   Preanesthetic Checklist Completed: patient identified, site marked, surgical consent, pre-op evaluation, timeout performed, IV checked, risks and benefits discussed and monitors and equipment checked  Epidural Patient position: sitting Prep: site prepped and draped and DuraPrep Patient monitoring: continuous pulse ox and blood pressure Approach: midline Location: L3-L4 Injection technique: LOR air  Needle:  Needle type: Tuohy  Needle gauge: 17 G Needle length: 9 cm and 9 Needle insertion depth: 8 cm Catheter type: closed end flexible Catheter size: 19 Gauge Catheter at skin depth: 14 cm Test dose: negative  Assessment Events: blood not aspirated, injection not painful, no injection resistance, negative IV test and no paresthesia  Additional Notes 1 attempt  Pt tolerated procedure well.

## 2018-06-10 NOTE — Progress Notes (Signed)
Erica Barber is a 22 y.o. G2P1001 at 5142w5d admitted for PROM  Subjective: Pt remains comfortable, not feeling contractions. Family in room for support.  Objective: BP 107/61   Pulse 84   Temp 97.8 F (36.6 C) (Axillary)   Resp 16   Wt 97.6 kg   LMP 08/20/2017 (Approximate)   SpO2 100% Comment: ra  BMI 38.10 kg/m  No intake/output data recorded. No intake/output data recorded.  FHT:  FHR: 145 bpm, variability: moderate,  accelerations:  Present,  decelerations:  Absent UC:   Rare SVE:   Dilation: 4 Effacement (%): 50 Station: -3 Exam by:: Sharen CounterLisa, Leftwich-Kirby, CNM  Labs: Lab Results  Component Value Date   WBC 7.5 06/09/2018   HGB 10.7 (L) 06/09/2018   HCT 34.8 (L) 06/09/2018   MCV 84.7 06/09/2018   PLT 132 (L) 06/09/2018    Assessment / Plan: PROM without onset of labor  Labor: Pitocin at 12 milliunits/min. Continue to increase per protocol. Preeclampsia:  n/a Fetal Wellbeing:  Category I Pain Control:  Labor support without medications I/D:  GBS positive on PCN Anticipated MOD:  NSVD  Sharen CounterLisa Leftwich-Kirby 06/10/2018, 12:04 AM

## 2018-06-10 NOTE — Progress Notes (Signed)
Erica Barber is a [age] y.o. G2P1001 at 673w5d admitted for PROM  Subjective: Pt starting to feel contractions, coping well without medications.  Objective: BP 111/87   Pulse 77   Temp 97.8 F (36.6 C) (Axillary)   Resp 16   Wt 97.6 kg   LMP 08/20/2017 (Approximate)   SpO2 93%   BMI 38.10 kg/m  No intake/output data recorded. No intake/output data recorded.  FHT:  FHR: 145 bpm, variability: moderate,  accelerations:  Present,  decelerations:  Present Prolonged x 2 lasting 3+ minutes followed by variables, improved with position change, Pitocin off, IV fluid bolus UC:   regular, every 3-4 minutes SVE:   Dilation: 4 Effacement (%): 50 Station: -3 Exam by:: Sharen CounterLisa, Leftwich-Kirby, CNM  Labs: Lab Results  Component Value Date   WBC 7.5 06/09/2018   HGB 10.7 (L) 06/09/2018   HCT 34.8 (L) 06/09/2018   MCV 84.7 06/09/2018   PLT 132 (L) 06/09/2018    Assessment / Plan: Augmentation of labor, progressing well  Labor: Contractions Q 3-4 minutes and pt feeling them after Pitocin off x 40 minutes. Restart at 2 milliunits. Increase per protocol.   Preeclampsia:  n/a Fetal Wellbeing:  Category II Pain Control:  Labor support without medications I/D:  GBS positive on PCN Anticipated MOD:  NSVD  Sharen CounterLisa Leftwich-Kirby 06/10/2018, 12:58 AM

## 2018-06-10 NOTE — Lactation Note (Signed)
This note was copied from a baby's chart. Lactation Consultation Note  Patient Name: Erica Milly JakobShataura Orihuela ZOXWR'UToday's Date: 06/10/2018 Reason for consult: Initial assessment;Term  P2 mother whose infant is now 10011 hours old.  Mother breast fed her first child.  Mother was attempting to latch baby as I arrived.  Suggested she feed STS and mother willingly removed blanket.  Assisted to latch in the football hold on the left breast without difficulty.  Educated mother on feeding cues, how to obtain a wide mouth, deep latching techniques, breast compressions and how to keep a sleepy baby awake during feedings.  Demonstrated breast compressions and mother did a return demonstration.  She was unable to obtain any EBM at this time.  She will feed back any EBM she obtains with hand expression.  Encouraged to feed 8-12 times/24 hours or sooner if baby shows feeding cues.  Mother will call RN/LC for assistance as needed.  Mom made aware of O/P services, breastfeeding support groups, community resources, and our phone # for post-discharge questions. Mother alone in room at this time.   Maternal Data Formula Feeding for Exclusion: No Has patient been taught Hand Expression?: Yes Does the patient have breastfeeding experience prior to this delivery?: Yes  Feeding Feeding Type: Breast Fed  LATCH Score Latch: Grasps breast easily, tongue down, lips flanged, rhythmical sucking.  Audible Swallowing: A few with stimulation  Type of Nipple: Everted at rest and after stimulation  Comfort (Breast/Nipple): Soft / non-tender  Hold (Positioning): Assistance needed to correctly position infant at breast and maintain latch.  LATCH Score: 8  Interventions Interventions: Breast feeding basics reviewed;Assisted with latch;Skin to skin;Breast massage;Hand express;Breast compression;Adjust position;Position options;Support pillows  Lactation Tools Discussed/Used WIC Program: Yes   Consult Status Consult  Status: Follow-up Date: 06/11/18 Follow-up type: In-patient    Shaelynn Dragos R Hamdi Vari 06/10/2018, 2:53 PM

## 2018-06-10 NOTE — Anesthesia Postprocedure Evaluation (Signed)
Anesthesia Post Note  Patient: Erica Barber  Procedure(s) Performed: AN AD HOC LABOR EPIDURAL     Patient location during evaluation: Mother Baby Anesthesia Type: Epidural Level of consciousness: awake and alert Pain management: pain level controlled Vital Signs Assessment: post-procedure vital signs reviewed and stable Respiratory status: spontaneous breathing, nonlabored ventilation and respiratory function stable Cardiovascular status: stable Postop Assessment: no headache, no backache and epidural receding Anesthetic complications: no    Last Vitals:  Vitals:   06/10/18 0600 06/10/18 0733  BP: 127/66 101/80  Pulse: 67 75  Resp: 20 18  Temp: 37.6 C 36.8 C  SpO2: 99%     Last Pain:  Vitals:   06/10/18 0733  TempSrc:   PainSc: 0-No pain   Pain Goal: Patients Stated Pain Goal: 2 (06/10/18 0425)               Junious SilkGILBERT,[name]

## 2018-06-10 NOTE — Lactation Note (Signed)
This note was copied from a baby's chart. Lactation Consultation Note  Patient Name: Erica Barber AVWUJ'WToday's Date: 06/10/2018   [facility]  Mother sleeping and baby in bassinet sleeping: will return later today.      [name] R Ilaria Much 06/10/2018, 10:36 AM

## 2018-06-11 LAB — RUBELLA SCREEN: RUBELLA: 6.38 {index} (ref >0.99)

## 2018-06-11 NOTE — Progress Notes (Signed)
CSW received consult for hx of marijuana use.  Referral was screened out due to the following: ~MOB had no documented substance use after initial prenatal visit/+UPT. ~MOB had no positive drug screens after initial prenatal visit/+UPT. ~Baby's UDS is negative.  Please consult CSW if current concerns arise or by MOB's request.  CSW will monitor CDS results and make report to Child Protective Services if warranted.  Wynter Isaacs, LCSWA Clinical Social Worker [facility] Cell#: [phone]  

## 2018-06-11 NOTE — Lactation Note (Signed)
This note was copied from a baby's chart. Lactation Consultation Note  Patient Name: Erica Barber ZOXWR'UToday's Date: 06/11/2018 Reason for consult: Follow-up assessment;Term  P2 mother whose infant is now 3437 hours old.  Mother breast fed her first child for 10 months.  Baby awake and alert; offered to assist with latching and mother accepted.  Assisted to latch in the football hold on the left breast without difficulty.  Baby had wide open mouth and flanged lips.  Intermittent swallows noted.  Demonstrated techniques to help keep baby awake at breast.  Mother did a return demonstration of breast compressions.  She is familiar with feeding cues and hand expression.  Mother was able to express a few drops of colostrum and knows to feed back any EBM she obtains to baby.    Encouraged to feed 8-12 times/24 hours of sooner if baby shows cues.  Explained the importance of observing cues and latching to the breast before baby becomes frustrated and cries for her feeding.    [facility].  She plans to return to work in approximately 3 weeks, although states she does not have a job right now.  Father and son present.  Mother will call for latch assistance as needed.   Maternal Data Formula Feeding for Exclusion: No Has patient been taught Hand Expression?: Yes Does the patient have breastfeeding experience prior to this delivery?: Yes  Feeding Feeding Type: Breast Fed Nipple Type: Slow - flow  LATCH Score Latch: Grasps breast easily, tongue down, lips flanged, rhythmical sucking.  Audible Swallowing: Spontaneous and intermittent  Type of Nipple: Everted at rest and after stimulation  Comfort (Breast/Nipple): Soft / non-tender  Hold (Positioning): Assistance needed to correctly position infant at breast and maintain latch.  LATCH Score: 9  Interventions Interventions: Breast feeding basics reviewed;Assisted with latch;Skin to skin;Breast massage;Hand  express;Breast compression;Adjust position;Hand pump;Position options;Support pillows  Lactation Tools Discussed/Used WIC Program: Yes Pump Review: Setup, frequency, and cleaning;Milk Storage Initiated by:: Erica Barber Date initiated:: 06/11/18   Consult Status Consult Status: Follow-up Date: 06/05/18 Follow-up type: In-patient    Erica Barber 06/11/2018, 4:34 PM

## 2018-06-11 NOTE — Progress Notes (Signed)
POSTPARTUM PROGRESS NOTE  Post Partum Day 1  Subjective:  Erica Barber is a 22 y.o. [id number] s/p NSVD at 260w5d.  She reports she is doing well. No acute events overnight. She denies any problems with ambulating, voiding or po intake. Denies nausea or vomiting.  Pain is well controlled.  Lochia is moderate, she is soaking through 3 pads per 24hrs.  Objective: Blood pressure 130/81, pulse 73, temperature 98.2 F (36.8 C), temperature source Oral, resp. rate 16, height 5\' 3"  (1.6 m), weight 97.6 kg, last menstrual period 08/20/2017, SpO2 100 %, unknown if currently breastfeeding.  Physical Exam:  General: alert, cooperative and no distress Chest: no respiratory distress Heart:regular rate, distal pulses intact Abdomen: soft, nontender Uterine Fundus: firm, appropriately tender DVT Evaluation: No calf swelling or tenderness Extremities: No edema Skin: warm, dry  Recent Labs    06/10/18 0213 06/10/18 0430  HGB 11.4* 10.5*  HCT 36.0 33.9*    Assessment/Plan: Erica Barber is a 22 y.o. [id number] s/p NSVD at 9360w5d   PPD#1 - Doing well - Routine postpartum care   Contraception: Undecided but interested in IUD  Feeding: Breast and bottle Dispo: Plan for discharge tomorrow.   LOS: 2 days   Cecelia ByarsErin Stasia Somero, PA-S. 06/11/2018, 7:25 AM

## 2018-06-12 MED ORDER — ABDOMINAL BINDER/ELASTIC LARGE MISC: 1.0000 | 0 refills | Status: DC | PRN

## 2018-06-12 NOTE — Lactation Note (Signed)
This note was copied from a baby's chart. Lactation Consultation Note: Mother reports that she is concerned about her milk coming in. Discussed goals for breastfeeding. Mother reports that she wants to breastfeed and formula feed.  Mother has a harmony hand pump. Discussed need to start pumping.  Advised to  Pump every 2-3 hours for 15 mins on each breast.  Discussed treatment and prevention of engorgement.  Mother is active with WIC. [facility]  Patient Name: Erica Barber ZOXWR'UToday's Date: 06/12/2018 Reason for consult: Follow-up assessment   Maternal Data    Feeding Feeding Type: Formula Nipple Type: Slow - flow  LATCH Score                   Interventions Interventions: Hand express;Hand pump  Lactation Tools Discussed/Used     Consult Status Consult Status: Complete    Michel BickersKendrick, Kevonta Phariss McCoy 06/12/2018, 10:39 AM

## 2018-06-12 NOTE — Discharge Summary (Signed)
OB Discharge Summary     Patient Name: Erica RacerShataura S Casto DOB: 05-09-1996 MRN: [id number]  Date of admission: 06/09/2018 Delivering MD: Sharen CounterLEFTWICH-KIRBY, LISA A   Date of discharge: 06/12/2018  Admitting diagnosis: 40 wks water broke 2 hrs ago Intrauterine pregnancy: 6975w5d     Secondary diagnosis:  Active Problems:   Normal labor   NSVD (normal spontaneous vaginal delivery)   Shoulder dystocia during labor and delivery, delivered  Additional problems: Anemia      Discharge diagnosis: Term Pregnancy Delivered and Anemia                                                                                                Post partum procedures:none  Augmentation: Pitocin  Complications: Shoulder dystocia   Hospital course:  Onset of Labor With Vaginal Delivery     22 y.o. yo [id number] at 6275w5d was admitted in Latent Labor on 06/09/2018. Patient had an uncomplicated labor course as follows:  Membrane Rupture Time/Date: 2:30 PM ,06/09/2018   Intrapartum Procedures: Episiotomy: None [1]                                         Lacerations:  None [1]  Patient had a delivery of a Viable infant. 06/10/2018  Information for the patient's newborn:  Dione Boozedams, Girl Cynthea [[id number]][[id number]]  Delivery Method: Vag-Spont    Pateint had an uncomplicated postpartum course.  She is ambulating, tolerating a regular diet, passing flatus, and urinating well. Patient is discharged home in stable condition on 06/12/18.   Physical exam  Vitals:   06/10/18 2214 06/11/18 0513 06/11/18 2157 06/12/18 0500  BP: 97/65 130/81 125/88 115/75  Pulse: 83 73 93 74  Resp: 16 16 18 20   Temp: 98.5 F (36.9 C) 98.2 F (36.8 C) 97.9 F (36.6 C) 98.2 F (36.8 C)  TempSrc: Oral Oral Oral Oral  SpO2: 100% 100%  100%  Weight:      Height:       General: alert, cooperative and no distress Lochia: appropriate Uterine Fundus: firm Incision: N/A DVT Evaluation: No evidence of DVT seen on physical exam. No significant  calf/ankle edema. Labs: Lab Results  Component Value Date   WBC 13.4 (H) 06/10/2018   HGB 10.5 (L) 06/10/2018   HCT 33.9 (L) 06/10/2018   MCV 84.5 06/10/2018   PLT 122 (L) 06/10/2018   CMP Latest Ref Rng & Units 08/18/2015  Glucose 65 - 99 mg/dL 130(Q106(H)  BUN 6 - 20 mg/dL 9  Creatinine 6.570.44 - 8.461.00 mg/dL 9.620.75  Sodium 952135 - 841145 mmol/L 137  Potassium 3.5 - 5.1 mmol/L 3.4(L)  Chloride 101 - 111 mmol/L 102  CO2 22 - 32 mmol/L 25  Calcium 8.9 - 10.3 mg/dL 9.3  Total Protein 6.5 - 8.1 g/dL 7.7  Total Bilirubin 0.3 - 1.2 mg/dL 0.9  Alkaline Phos 38 - 126 U/L 45  AST 15 - 41 U/L 16  ALT 14 - 54 U/L 14    Discharge instruction: per After Visit  Summary and "Baby and Me Booklet".  After visit meds:  Allergies as of 06/12/2018   No Known Allergies     Medication List    TAKE these medications   PRENATAL GUMMIES/DHA & FA 0.4-32.5 MG Chew Chew 1 each by mouth at bedtime.       Diet: routine diet  Activity: Advance as tolerated. Pelvic rest for 6 weeks.   Outpatient follow up:4 weeks, plans to [facility] Follow up Appt:No future appointments. Follow up Visit:No follow-ups on file.  Postpartum contraception: Undecided  Newborn Data: Live born female  Birth Weight: 7 lb 12.2 oz (3521 g) APGAR: 7, 9  Newborn Delivery   Birth date/time:  06/10/2018 03:14:00 Delivery type:  Vaginal, Spontaneous     Baby Feeding: Bottle and Breast Disposition:home with mother   06/12/2018 Sharyon Cable, CNM

## 2019-06-27 ENCOUNTER — Inpatient Hospital Stay (HOSPITAL_COMMUNITY): Payer: Medicaid Other

## 2019-06-27 ENCOUNTER — Encounter (HOSPITAL_COMMUNITY): Payer: Self-pay | Admitting: Obstetrics and Gynecology

## 2019-06-27 ENCOUNTER — Ambulatory Visit
Admission: EM | Admit: 2019-06-27 | Discharge: 2019-06-27 | Disposition: A | Discharge status: Home / Self Care | Payer: Medicaid Other | Attending: Emergency Medicine | Admitting: Emergency Medicine

## 2019-06-27 ENCOUNTER — Encounter: Payer: Self-pay | Admitting: Emergency Medicine

## 2019-06-27 ENCOUNTER — Inpatient Hospital Stay (HOSPITAL_COMMUNITY)
Admission: AD | Admit: 2019-06-27 | Discharge: 2019-06-27 | Disposition: A | Discharge status: Home / Self Care | Payer: Medicaid Other | Attending: Obstetrics and Gynecology | Admitting: Obstetrics and Gynecology

## 2019-06-27 ENCOUNTER — Other Ambulatory Visit: Payer: Self-pay

## 2019-06-27 DIAGNOSIS — O26892 Other specified pregnancy related conditions, second trimester: Secondary | ICD-10-CM | POA: Insufficient documentation

## 2019-06-27 DIAGNOSIS — Z3201 Encounter for pregnancy test, result positive: Secondary | ICD-10-CM | POA: Diagnosis not present

## 2019-06-27 DIAGNOSIS — R102 Pelvic and perineal pain: Secondary | ICD-10-CM | POA: Insufficient documentation

## 2019-06-27 DIAGNOSIS — O99891 Other specified diseases and conditions complicating pregnancy: Secondary | ICD-10-CM | POA: Insufficient documentation

## 2019-06-27 DIAGNOSIS — R519 Headache, unspecified: Secondary | ICD-10-CM | POA: Insufficient documentation

## 2019-06-27 DIAGNOSIS — R110 Nausea: Secondary | ICD-10-CM | POA: Insufficient documentation

## 2019-06-27 DIAGNOSIS — N898 Other specified noninflammatory disorders of vagina: Secondary | ICD-10-CM | POA: Diagnosis not present

## 2019-06-27 DIAGNOSIS — N760 Acute vaginitis: Secondary | ICD-10-CM | POA: Insufficient documentation

## 2019-06-27 DIAGNOSIS — R109 Unspecified abdominal pain: Secondary | ICD-10-CM

## 2019-06-27 DIAGNOSIS — Z3A19 19 weeks gestation of pregnancy: Secondary | ICD-10-CM | POA: Diagnosis not present

## 2019-06-27 DIAGNOSIS — Z3A11 11 weeks gestation of pregnancy: Secondary | ICD-10-CM

## 2019-06-27 DIAGNOSIS — R1030 Lower abdominal pain, unspecified: Secondary | ICD-10-CM | POA: Diagnosis not present

## 2019-06-27 DIAGNOSIS — O26891 Other specified pregnancy related conditions, first trimester: Secondary | ICD-10-CM | POA: Diagnosis not present

## 2019-06-27 DIAGNOSIS — N949 Unspecified condition associated with female genital organs and menstrual cycle: Secondary | ICD-10-CM

## 2019-06-27 DIAGNOSIS — B9689 Other specified bacterial agents as the cause of diseases classified elsewhere: Secondary | ICD-10-CM | POA: Insufficient documentation

## 2019-06-27 DIAGNOSIS — O26899 Other specified pregnancy related conditions, unspecified trimester: Secondary | ICD-10-CM

## 2019-06-27 LAB — COMPREHENSIVE METABOLIC PANEL
ALT: 7 U/L (ref 0–44)
AST: 12 U/L — ABNORMAL LOW (ref 15–41)
Albumin: 3 g/dL — ABNORMAL LOW (ref 3.5–5.0)
Alkaline Phosphatase: 36 U/L — ABNORMAL LOW (ref 38–126)
Anion gap: 9 (ref 5–15)
BUN: <5 mg/dL — ABNORMAL LOW (ref 6–20)
CO2: 24 mmol/L (ref 22–32)
Calcium: 8.8 mg/dL — ABNORMAL LOW (ref 8.9–10.3)
Chloride: 104 mmol/L (ref 98–111)
Creatinine, Ser: 0.6 mg/dL (ref 0.44–1.00)
GFR calc Af Amer: >60 mL/min (ref >60)
GFR calc non Af Amer: >60 mL/min (ref >60)
Glucose, Bld: 79 mg/dL (ref 70–99)
Potassium: 3.5 mmol/L (ref 3.5–5.1)
Sodium: 137 mmol/L (ref 135–145)
Total Bilirubin: 0.6 mg/dL (ref 0.3–1.2)
Total Protein: 6.2 g/dL — ABNORMAL LOW (ref 6.5–8.1)

## 2019-06-27 LAB — CBC
HCT: 37.2 % (ref 36.0–46.0)
Hemoglobin: 12.2 g/dL (ref 12.0–15.0)
MCH: 28.7 pg (ref 26.0–34.0)
MCHC: 32.8 g/dL (ref 30.0–36.0)
MCV: 87.5 fL (ref 80.0–100.0)
Platelets: 173 10*3/uL (ref 150–400)
RBC: 4.25 MIL/uL (ref 3.87–5.11)
RDW: 13.4 % (ref 11.5–15.5)
WBC: 5.5 10*3/uL (ref 4.0–10.5)
nRBC: 0 % (ref 0.0–0.2)

## 2019-06-27 LAB — POCT URINALYSIS DIP (MANUAL ENTRY)
Bilirubin, UA: NEGATIVE
Blood, UA: NEGATIVE
Glucose, UA: NEGATIVE mg/dL
Leukocytes, UA: NEGATIVE
Nitrite, UA: NEGATIVE
Protein Ur, POC: 30 mg/dL — AB
Spec Grav, UA: >=1.03 — AB (ref 1.010–1.025)
Urobilinogen, UA: 2 E.U./dL — AB
pH, UA: 7 (ref 5.0–8.0)

## 2019-06-27 LAB — URINALYSIS, ROUTINE W REFLEX MICROSCOPIC
Bilirubin Urine: NEGATIVE
Glucose, UA: NEGATIVE mg/dL
Hgb urine dipstick: NEGATIVE
Ketones, ur: NEGATIVE mg/dL
Leukocytes,Ua: NEGATIVE
Nitrite: NEGATIVE
Protein, ur: NEGATIVE mg/dL
Specific Gravity, Urine: 1.024 (ref 1.005–1.030)
pH: 6 (ref 5.0–8.0)

## 2019-06-27 LAB — WET PREP, GENITAL
Sperm: NONE SEEN
Trich, Wet Prep: NONE SEEN
Yeast Wet Prep HPF POC: NONE SEEN

## 2019-06-27 LAB — HCG, QUANTITATIVE, PREGNANCY: hCG, Beta Chain, Quant, S: 64784 m[IU]/mL — ABNORMAL HIGH (ref <5)

## 2019-06-27 LAB — POCT URINE PREGNANCY: Preg Test, Ur: POSITIVE — AB

## 2019-06-27 MED ORDER — METOCLOPRAMIDE HCL 10 MG PO TABS
10.0000 mg | ORAL_TABLET | Freq: Once | ORAL | Status: AC
  Administered 2019-06-27: 10 mg via ORAL
  Filled 2019-06-27: qty 1

## 2019-06-27 MED ORDER — METOCLOPRAMIDE HCL 5 MG/ML IJ SOLN: 10.0000 mg | Freq: Once | INTRAMUSCULAR | Status: DC

## 2019-06-27 MED ORDER — METRONIDAZOLE 500 MG PO TABS: 500.0000 mg | ORAL_TABLET | Freq: Two times a day (BID) | ORAL | 0 refills | Status: AC

## 2019-06-27 MED ORDER — METOCLOPRAMIDE HCL 10 MG PO TABS: 10.0000 mg | ORAL_TABLET | Freq: Three times a day (TID) | ORAL | 0 refills | Status: DC | PRN

## 2019-06-27 MED ORDER — ACETAMINOPHEN 500 MG PO TABS
1000.0000 mg | ORAL_TABLET | Freq: Once | ORAL | Status: AC
  Administered 2019-06-27: 1000 mg via ORAL
  Filled 2019-06-27: qty 2

## 2019-06-27 NOTE — MAU Note (Signed)
.   Erica Barber is a 23 y.o. at [redacted]w[redacted]d here in MAU reporting: lower abdominal cramping since Monday. Slight headache for 2 days. Denies any VB LMP:  Onset of complaint: Monday Pain score: 4 Vitals:   06/27/19 1725 06/27/19 1726  BP: 123/65 123/65  Pulse: 86   Resp: 18   Temp: 98.6 F (37 C)      FHT:154 Lab orders placed from triage:UA

## 2019-06-27 NOTE — ED Notes (Signed)
Patient able to ambulate independently  

## 2019-06-27 NOTE — Discharge Instructions (Signed)
Abdominal Pain During Pregnancy  Abdominal pain is common during pregnancy, and has many possible causes. Some causes are more serious than others, and sometimes the cause is not known. Abdominal pain can be a sign that labor is starting. It can also be caused by normal growth and stretching of muscles and ligaments during pregnancy. Always tell your health care provider if you have any abdominal pain. Follow these instructions at home:  Do not have sex or put anything in your vagina until your pain goes away completely.  Get plenty of rest until your pain improves.  Drink enough fluid to keep your urine pale yellow.  Take over-the-counter and prescription medicines only as told by your health care provider.  Keep all follow-up visits as told by your health care provider. This is important. Contact a health care provider if:  Your pain continues or gets worse after resting.  You have lower abdominal pain that: ? Comes and goes at regular intervals. ? Spreads to your back. ? Is similar to menstrual cramps.  You have pain or burning when you urinate. Get help right away if:  You have a fever or chills.  You have vaginal bleeding.  You are leaking fluid from your vagina.  You are passing tissue from your vagina.  You have vomiting or diarrhea that lasts for more than 24 hours.  Your baby is moving less than usual.  You feel very weak or faint.  You have shortness of breath.  You develop severe pain in your upper abdomen. Summary  Abdominal pain is common during pregnancy, and has many possible causes.  If you experience abdominal pain during pregnancy, tell your health care provider right away.  Follow your health care provider's home care instructions and keep all follow-up visits as directed. This information is not intended to replace advice given to you by your health care provider. Make sure you discuss any questions you have with your health care  provider. Document Released: 07/04/2005 Document Revised: 10/22/2018 Document Reviewed: 10/06/2016 Elsevier Patient Education  2020 [employer].  Bacterial Vaginosis  Bacterial vaginosis is a vaginal infection that occurs when the normal balance of bacteria in the vagina is disrupted. It results from an overgrowth of certain bacteria. This is the most common vaginal infection among women ages 27-44. Because bacterial vaginosis increases your risk for STIs (sexually transmitted infections), getting treated can help reduce your risk for chlamydia, gonorrhea, herpes, and HIV (human immunodeficiency virus). Treatment is also important for preventing complications in pregnant women, because this condition can cause an early (premature) delivery. What are the causes? This condition is caused by an increase in harmful bacteria that are normally present in small amounts in the vagina. However, the reason that the condition develops is not fully understood. What increases the risk? The following factors may make you more likely to develop this condition:  Having a new sexual partner or multiple sexual partners.  Having unprotected sex.  Douching.  Having an intrauterine device (IUD).  Smoking.  Drug and alcohol abuse.  Taking certain antibiotic medicines.  Being pregnant. You cannot get bacterial vaginosis from toilet seats, bedding, swimming pools, or contact with objects around you. What are the signs or symptoms? Symptoms of this condition include:  Grey or white vaginal discharge. The discharge can also be watery or foamy.  A fish-like odor with discharge, especially after sexual intercourse or during menstruation.  Itching in and around the vagina.  Burning or pain with urination. Some women  with bacterial vaginosis have no signs or symptoms. How is this diagnosed? This condition is diagnosed based on:  Your medical history.  A physical exam of the vagina.  Testing a  sample of vaginal fluid under a microscope to look for a large amount of bad bacteria or abnormal cells. Your health care provider may use a cotton swab or a small wooden spatula to collect the sample. How is this treated? This condition is treated with antibiotics. These may be given as a pill, a vaginal cream, or a medicine that is put into the vagina (suppository). If the condition comes back after treatment, a second round of antibiotics may be needed. Follow these instructions at home: Medicines  Take over-the-counter and prescription medicines only as told by your health care provider.  Take or use your antibiotic as told by your health care provider. Do not stop taking or using the antibiotic even if you start to feel better. General instructions  If you have a female sexual partner, tell her that you have a vaginal infection. She should see her health care provider and be treated if she has symptoms. If you have a female sexual partner, he does not need treatment.  During treatment: ? Avoid sexual activity until you finish treatment. ? Do not douche. ? Avoid alcohol as directed by your health care provider. ? Avoid breastfeeding as directed by your health care provider.  Drink enough water and fluids to keep your urine clear or pale yellow.  Keep the area around your vagina and rectum clean. ? Wash the area daily with warm water. ? Wipe yourself from front to back after using the toilet.  Keep all follow-up visits as told by your health care provider. This is important. How is this prevented?  Do not douche.  Wash the outside of your vagina with warm water only.  Use protection when having sex. This includes latex condoms and dental dams.  Limit how many sexual partners you have. To help prevent bacterial vaginosis, it is best to have sex with just one partner (monogamous).  Make sure you and your sexual partner are tested for STIs.  Wear cotton or cotton-lined  underwear.  Avoid wearing tight pants and pantyhose, especially during summer.  Limit the amount of alcohol that you drink.  Do not use any products that contain nicotine or tobacco, such as cigarettes and e-cigarettes. If you need help quitting, ask your health care provider.  Do not use illegal drugs. Where to find more information  Centers for Disease Control and Prevention: SolutionApps.co.zawww.cdc.gov/std  American Sexual Health Association (ASHA): www.ashastd.org  U.S. Department of Health and Health and safety inspectorHuman Services, Office on Women's Health: ConventionalMedicines.siwww.womenshealth.gov/ or http://www.anderson-williamson.info/https://www.womenshealth.gov/a-z-topics/bacterial-vaginosis Contact a health care provider if:  Your symptoms do not improve, even after treatment.  You have more discharge or pain when urinating.  You have a fever.  You have pain in your abdomen.  You have pain during sex.  You have vaginal bleeding between periods. Summary  Bacterial vaginosis is a vaginal infection that occurs when the normal balance of bacteria in the vagina is disrupted.  Because bacterial vaginosis increases your risk for STIs (sexually transmitted infections), getting treated can help reduce your risk for chlamydia, gonorrhea, herpes, and HIV (human immunodeficiency virus). Treatment is also important for preventing complications in pregnant women, because the condition can cause an early (premature) delivery.  This condition is treated with antibiotic medicines. These may be given as a pill, a vaginal cream, or a medicine that  is put into the vagina (suppository). This information is not intended to replace advice given to you by your health care provider. Make sure you discuss any questions you have with your health care provider. Document Released: 07/04/2005 Document Revised: 06/16/2017 Document Reviewed: 03/19/2016 Elsevier Patient Education  2020 [employer].  Second Trimester of Pregnancy The second trimester is from week 14 through week 27 (months  4 through 6). The second trimester is often a time when you feel your best. Your body has adjusted to being pregnant, and you begin to feel better physically. Usually, morning sickness has lessened or quit completely, you may have more energy, and you may have an increase in appetite. The second trimester is also a time when the fetus is growing rapidly. At the end of the sixth month, the fetus is about 9 inches long and weighs about 1 pounds. You will likely begin to feel the baby move (quickening) between 16 and 20 weeks of pregnancy. Body changes during your second trimester Your body continues to go through many changes during your second trimester. The changes vary from woman to woman.  Your weight will continue to increase. You will notice your lower abdomen bulging out.  You may begin to get stretch marks on your hips, abdomen, and breasts.  You may develop headaches that can be relieved by medicines. The medicines should be approved by your health care provider.  You may urinate more often because the fetus is pressing on your bladder.  You may develop or continue to have heartburn as a result of your pregnancy.  You may develop constipation because certain hormones are causing the muscles that push waste through your intestines to slow down.  You may develop hemorrhoids or swollen, bulging veins (varicose veins).  You may have back pain. This is caused by: ? Weight gain. ? Pregnancy hormones that are relaxing the joints in your pelvis. ? A shift in weight and the muscles that support your balance.  Your breasts will continue to grow and they will continue to become tender.  Your gums may bleed and may be sensitive to brushing and flossing.  Dark spots or blotches (chloasma, mask of pregnancy) may develop on your face. This will likely fade after the baby is born.  A dark line from your belly button to the pubic area (linea nigra) may appear. This will likely fade after the baby  is born.  You may have changes in your hair. These can include thickening of your hair, rapid growth, and changes in texture. Some women also have hair loss during or after pregnancy, or hair that feels dry or thin. Your hair will most likely return to normal after your baby is born. What to expect at prenatal visits During a routine prenatal visit:  You will be weighed to make sure you and the fetus are growing normally.  Your blood pressure will be taken.  Your abdomen will be measured to track your baby's growth.  The fetal heartbeat will be listened to.  Any test results from the previous visit will be discussed. Your health care provider may ask you:  How you are feeling.  If you are feeling the baby move.  If you have had any abnormal symptoms, such as leaking fluid, bleeding, severe headaches, or abdominal cramping.  If you are using any tobacco products, including cigarettes, chewing tobacco, and electronic cigarettes.  If you have any questions. Other tests that may be performed during your second trimester include:  Blood tests that check for: ? Low iron levels (anemia). ? High blood sugar that affects pregnant women (gestational diabetes) between 34 and 28 weeks. ? Rh antibodies. This is to check for a protein on red blood cells (Rh factor).  Urine tests to check for infections, diabetes, or protein in the urine.  An ultrasound to confirm the proper growth and development of the baby.  An amniocentesis to check for possible genetic problems.  Fetal screens for spina bifida and Down syndrome.  HIV (human immunodeficiency virus) testing. Routine prenatal testing includes screening for HIV, unless you choose not to have this test. Follow these instructions at home: Medicines  Follow your health care provider's instructions regarding medicine use. Specific medicines may be either safe or unsafe to take during pregnancy.  Take a prenatal vitamin that contains at  least 600 micrograms (mcg) of folic acid.  If you develop constipation, try taking a stool softener if your health care provider approves. Eating and drinking   Eat a balanced diet that includes fresh fruits and vegetables, whole grains, good sources of protein such as meat, eggs, or tofu, and low-fat dairy. Your health care provider will help you determine the amount of weight gain that is right for you.  Avoid raw meat and uncooked cheese. These carry germs that can cause birth defects in the baby.  If you have low calcium intake from food, talk to your health care provider about whether you should take a daily calcium supplement.  Limit foods that are high in fat and processed sugars, such as fried and sweet foods.  To prevent constipation: ? Drink enough fluid to keep your urine clear or pale yellow. ? Eat foods that are high in fiber, such as fresh fruits and vegetables, whole grains, and beans. Activity  Exercise only as directed by your health care provider. Most women can continue their usual exercise routine during pregnancy. Try to exercise for 30 minutes at least 5 days a week. Stop exercising if you experience uterine contractions.  Avoid heavy lifting, wear low heel shoes, and practice good posture.  A sexual relationship may be continued unless your health care provider directs you otherwise. Relieving pain and discomfort  Wear a good support bra to prevent discomfort from breast tenderness.  Take warm sitz baths to soothe any pain or discomfort caused by hemorrhoids. Use hemorrhoid cream if your health care provider approves.  Rest with your legs elevated if you have leg cramps or low back pain.  If you develop varicose veins, wear support hose. Elevate your feet for 15 minutes, 3-4 times a day. Limit salt in your diet. Prenatal Care  Write down your questions. Take them to your prenatal visits.  Keep all your prenatal visits as told by your health care provider.  This is important. Safety  Wear your seat belt at all times when driving.  Make a list of emergency phone numbers, including numbers for family, friends, the hospital, and police and fire departments. General instructions  Ask your health care provider for a referral to a local prenatal education class. Begin classes no later than the beginning of month 6 of your pregnancy.  Ask for help if you have counseling or nutritional needs during pregnancy. Your health care provider can offer advice or refer you to specialists for help with various needs.  Do not use hot tubs, steam rooms, or saunas.  Do not douche or use tampons or scented sanitary pads.  Do not cross your legs  for long periods of time.  Avoid cat litter boxes and soil used by cats. These carry germs that can cause birth defects in the baby and possibly loss of the fetus by miscarriage or stillbirth.  Avoid all smoking, herbs, alcohol, and unprescribed drugs. Chemicals in these products can affect the formation and growth of the baby.  Do not use any products that contain nicotine or tobacco, such as cigarettes and e-cigarettes. If you need help quitting, ask your health care provider.  Visit your dentist if you have not gone yet during your pregnancy. Use a soft toothbrush to brush your teeth and be gentle when you floss. Contact a health care provider if:  You have dizziness.  You have mild pelvic cramps, pelvic pressure, or nagging pain in the abdominal area.  You have persistent nausea, vomiting, or diarrhea.  You have a bad smelling vaginal discharge.  You have pain when you urinate. Get help right away if:  You have a fever.  You are leaking fluid from your vagina.  You have spotting or bleeding from your vagina.  You have severe abdominal cramping or pain.  You have rapid weight gain or weight loss.  You have shortness of breath with chest pain.  You notice sudden or extreme swelling of your face,  hands, ankles, feet, or legs.  You have not felt your baby move in over an hour.  You have severe headaches that do not go away when you take medicine.  You have vision changes. Summary  The second trimester is from week 14 through week 27 (months 4 through 6). It is also a time when the fetus is growing rapidly.  Your body goes through many changes during pregnancy. The changes vary from woman to woman.  Avoid all smoking, herbs, alcohol, and unprescribed drugs. These chemicals affect the formation and growth your baby.  Do not use any tobacco products, such as cigarettes, chewing tobacco, and e-cigarettes. If you need help quitting, ask your health care provider.  Contact your health care provider if you have any questions. Keep all prenatal visits as told by your health care provider. This is important. This information is not intended to replace advice given to you by your health care provider. Make sure you discuss any questions you have with your health care provider. Document Released: 06/28/2001 Document Revised: 10/26/2018 Document Reviewed: 08/09/2016 Elsevier Patient Education  2020 Elsevier Inc.  Round Ligament Pain  The round ligament is a cord of muscle and tissue that helps support the uterus. It can become a source of pain during pregnancy if it becomes stretched or twisted as the baby grows. The pain usually begins in the second trimester (13-28 weeks) of pregnancy, and it can come and go until the baby is delivered. It is not a serious problem, and it does not cause harm to the baby. Round ligament pain is usually a short, sharp, and pinching pain, but it can also be a dull, lingering, and aching pain. The pain is felt in the lower side of the abdomen or in the groin. It usually starts deep in the groin and moves up to the outside of the hip area. The pain may occur when you:  Suddenly change position, such as quickly going from a sitting to standing position.  Roll  over in bed.  Cough or sneeze.  Do physical activity. Follow these instructions at home:   Watch your condition for any changes.  When the pain starts, relax. Then try  any of these methods to help with the pain: ? Sitting down. ? Flexing your knees up to your abdomen. ? Lying on your side with one pillow under your abdomen and another pillow between your legs. ? Sitting in a warm bath for 15-20 minutes or until the pain goes away.  Take over-the-counter and prescription medicines only as told by your health care provider.  Move slowly when you sit down or stand up.  Avoid long walks if they cause pain.  Stop or reduce your physical activities if they cause pain.  Keep all follow-up visits as told by your health care provider. This is important. Contact a health care provider if:  Your pain does not go away with treatment.  You feel pain in your back that you did not have before.  Your medicine is not helping. Get help right away if:  You have a fever or chills.  You develop uterine contractions.  You have vaginal bleeding.  You have nausea or vomiting.  You have diarrhea.  You have pain when you urinate. Summary  Round ligament pain is felt in the lower abdomen or groin. It is usually a short, sharp, and pinching pain. It can also be a dull, lingering, and aching pain.  This pain usually begins in the second trimester (13-28 weeks). It occurs because the uterus is stretching with the growing baby, and it is not harmful to the baby.  You may notice the pain when you suddenly change position, when you cough or sneeze, or during physical activity.  Relaxing, flexing your knees to your abdomen, lying on one side, or taking a warm bath may help to get rid of the pain.  Get help from your health care provider if the pain does not go away or if you have vaginal bleeding, nausea, vomiting, diarrhea, or painful urination. This information is not intended to replace  advice given to you by your health care provider. Make sure you discuss any questions you have with your health care provider. Document Released: 04/12/2008 Document Revised: 12/20/2017 Document Reviewed: 12/20/2017 Elsevier Patient Education  2020 Elsevier Inc. [place] [facility] Providers     [facility]     Phone: [phone]  [facility] [place]  Phone: [phone]  [facility] for [employer] at [place]  Phone: [phone]  [facility] for [employer] at [place]                           Phone: [phone]  [facility] for [facility] at [place]          Phone: [phone]  [facility]    Phone: [phone]   [facility] [place])    Phone: [phone]  [facility]    Phone: [phone]  [facility]    Phone: [phone]  [facility]    Phone: [phone]  [facility]  Phone: [phone]  [facility]               Phone: [phone]  [facility] [place]   Phone: [phone]  [facility]    Phone: [phone]                                  Safe Medications in Pregnancy    Acne: Benzoyl Peroxide Salicylic  Acid  Backache/Headache: Tylenol: 2 regular strength every 4 hours OR              2 Extra strength every 6 hours  Colds/Coughs/Allergies: Benadryl (alcohol free) 25 mg every 6 hours as needed Breath right strips Claritin Cepacol throat lozenges Chloraseptic throat spray Cold-Eeze- up to three times per day Cough drops, alcohol free Flonase (by prescription only) Guaifenesin Mucinex Robitussin DM (plain only, alcohol free) Saline nasal spray/drops Sudafed (pseudoephedrine) & Actifed ** use only after [redacted] weeks gestation and if you do not have high blood pressure Tylenol Vicks  Vaporub Zinc lozenges Zyrtec   Constipation: Colace Ducolax suppositories Fleet enema Glycerin suppositories Metamucil Milk of magnesia Miralax Senokot Smooth move tea  Diarrhea: Kaopectate Imodium A-D  *NO pepto Bismol  Hemorrhoids: Anusol Anusol HC Preparation H Tucks  Indigestion: Tums Maalox Mylanta Zantac  Pepcid  Insomnia: Benadryl (alcohol free)  every 6 hours as needed Tylenol PM Unisom, no Gelcaps  Leg Cramps: Tums MagGel  Nausea/Vomiting:  Bonine Dramamine Emetrol Ginger extract Sea bands Meclizine  Nausea medication to take during pregnancy:  Unisom (doxylamine succinate 25 mg tablets) Take one tablet daily at bedtime. If symptoms are not adequately controlled, the dose can be increased to a maximum recommended dose of two tablets daily (1/2 tablet in the morning, 1/2 tablet mid-afternoon and one at bedtime). Vitamin B6  tablets. Take one tablet twice a day (up to 200 mg per day).  Skin Rashes: Aveeno products Benadryl cream or  every 6 hours as needed Calamine Lotion 1% cortisone cream  Yeast infection: Gyne-lotrimin 7 Monistat 7   **If taking multiple medications, please check labels to avoid duplicating the same active ingredients **take medication as directed on the label ** Do not exceed 4000 mg of tylenol in 24 hours **Do not take medications that contain aspirin or ibuprofen

## 2019-06-27 NOTE — ED Triage Notes (Addendum)
Pt presents to [facility] for assessment of 2 weeks of white vaginal discharge and itching.  C/o lower abdominal pain.  Pt c/o daily nausea as well, states her first prenatal visit isn't until the 2nd week in January, LMP end of September.

## 2019-06-27 NOTE — Discharge Instructions (Addendum)
23 year old female who is pregnant (LMP 04/10/2019, approximately 53 gestation) presenting for 2-week course of vaginal discharge, week 1 course of lower abdominal pain.  Patient does not currently have OB care, is taking prenatal vitamins.  Patient referred to [facility] hospital for further evaluation/management.  Urine pregnancy positive in office today.

## 2019-06-27 NOTE — ED Provider Notes (Signed)
[facility]    CSN: [id number] Arrival date & time: 06/27/19  1510      History   Chief Complaint Chief Complaint  Patient presents with  . Vaginal Discharge    HPI Erica Barber is a G3, P2 23 y.o. female with history of asthma presenting for 2-week course of vaginal discharge, lower abdominal pain.  LMP 04/13/2019: States she had positive pregnancy test at health department, though has not been able to schedule any prenatal care.  Currently taking prenatal vitamins.  Patient denying fever, vaginal bleeding.  Does endorse history of BV, not sure if this is similar.  Past Medical History:  Diagnosis Date  . Asthma     Patient Active Problem List   Diagnosis Date Noted  . NSVD (normal spontaneous vaginal delivery) 06/10/2018  . Shoulder dystocia during labor and delivery, delivered 06/10/2018  . Normal labor 06/09/2018  . Group beta Strep positive 04/11/2016  . Anemia 04/11/2016  . Vaginal delivery 04/10/2016  . Asthma 10/16/2015    Past Surgical History:  Procedure Laterality Date  . NO PAST SURGERIES      OB History    Gravida  3   Para  2   Term  2   Preterm      AB      Living  2     SAB      TAB      Ectopic      Multiple  0   Live Births  2            Home Medications    Prior to Admission medications   Medication Sig Start Date End Date Taking? Authorizing Provider  Elastic Bandages & Supports (ABDOMINAL BINDER/ELASTIC LARGE) MISC 1 each by Does not apply route as needed. 06/12/18   Calvert Cantor, CNM  Prenatal MV-Min-FA-Omega-3 (PRENATAL GUMMIES/DHA & FA) 0.4-32.5 MG CHEW Chew 1 each by mouth at bedtime. 10/23/17   Degele, Kandra Nicolas, MD    Family History Family History  Problem Relation Age of Onset  . Hypertension Father   . Hypertension Paternal Grandmother     Social History Social History   Tobacco Use  . Smoking status: Never Smoker  . Smokeless tobacco: Never Used  Substance Use Topics  .  Alcohol use: No  . Drug use: Yes    Types: Marijuana    Comment: not since she has been pregnant     Allergies   Patient has no known allergies.   Review of Systems Review of Systems  Constitutional: Negative for fatigue and fever.  HENT: Negative for ear pain, sinus pain, sore throat and voice change.   Eyes: Negative for pain, redness and visual disturbance.  Respiratory: Negative for cough and shortness of breath.   Cardiovascular: Negative for chest pain and palpitations.  Gastrointestinal: Positive for abdominal pain and nausea. Negative for blood in stool, diarrhea and vomiting.  Genitourinary: Positive for frequency and vaginal discharge. Negative for hematuria, pelvic pain and vaginal bleeding.  Musculoskeletal: Negative for arthralgias and myalgias.  Skin: Negative for rash and wound.  Neurological: Negative for syncope and headaches.     Physical Exam Triage Vital Signs ED Triage Vitals  Enc Vitals Group     BP      Pulse      Resp      Temp      Temp src      SpO2      Weight  Height      Head Circumference      Peak Flow      Pain Score      Pain Loc      Pain Edu?      Excl. in [place]?    No data found.  Updated Vital Signs BP 138/82 (BP Location: Left Arm)   Pulse 79   Temp (!) 97.5 F (36.4 C) (Temporal)   Resp 18   LMP 04/10/2019   SpO2 98%   Visual Acuity Right Eye Distance:   Left Eye Distance:   Bilateral Distance:    Right Eye Near:   Left Eye Near:    Bilateral Near:     Physical Exam Constitutional:      General: She is not in acute distress.    Appearance: She is not ill-appearing.  HENT:     Head: Normocephalic and atraumatic.     Mouth/Throat:     Mouth: Mucous membranes are moist.     Pharynx: Oropharynx is clear.  Eyes:     General: No scleral icterus.    Pupils: Pupils are equal, round, and reactive to light.  Cardiovascular:     Rate and Rhythm: Normal rate.  Pulmonary:     Effort: Pulmonary effort is normal.  No respiratory distress.     Breath sounds: No wheezing.  Abdominal:     Tenderness: There is abdominal tenderness. There is no right CVA tenderness, left CVA tenderness or guarding.     Comments: Lower abdominal TTP  Skin:    Capillary Refill: Capillary refill takes less than 2 seconds.     Coloration: Skin is not jaundiced or pale.  Neurological:     Mental Status: She is alert and oriented to person, place, and time.      UC Treatments / Results  Labs (all labs ordered are listed, but only abnormal results are displayed) Labs Reviewed  POCT URINALYSIS DIP (MANUAL ENTRY) - Abnormal; Notable for the following components:      Result Value   Ketones, POC UA trace (5) (*)    Spec Grav, UA >=1.030 (*)    Protein Ur, POC =30 (*)    Urobilinogen, UA 2.0 (*)    All other components within normal limits  POCT URINE PREGNANCY - Abnormal; Notable for the following components:   Preg Test, Ur Positive (*)    All other components within normal limits    EKG   Radiology No results found.  Procedures Procedures (including critical care time)  Medications Ordered in UC Medications - No data to display  Initial Impression / Assessment and Plan / UC Course  I have reviewed the triage vital signs and the nursing notes.  Pertinent labs & imaging results that were available during my care of the patient were reviewed by me and considered in my medical decision making (see chart for details).     Patient afebrile, nontoxic.  Urine pregnancy in office positive, urine dipstick reviewed by me showing trace ketones, 30 protein, 2.0 urobilinogen.  Given lower abdominal pain, discharge in setting of first trimester pregnancy, patient was referred to [facility] for further evaluation/management.  Patient self transporting in stable condition.  Return precautions discussed, patient verbalized understanding and is agreeable to plan. Final Clinical Impressions(s) / UC Diagnoses   Final  diagnoses:  [redacted] weeks gestation of pregnancy  Lower abdominal pain  Vaginal discharge     Discharge Instructions     23 year old female who is pregnant (  LMP 04/10/2019, approximately 11 gestation) presenting for 2-week course of vaginal discharge, week 1 course of lower abdominal pain.  Patient does not currently have OB care, is taking prenatal vitamins.  [facility].  Urine pregnancy positive in office today.    ED Prescriptions    None     PDMP not reviewed this encounter.   Hall-Potvin, [place], [place]-C 06/27/19 1626

## 2019-06-27 NOTE — MAU Provider Note (Addendum)
History     CSN: [id number]  Arrival date and time: 06/27/19 1627   First Provider Initiated Contact with Patient 06/27/19 1823      Chief Complaint  Patient presents with  . Abdominal Pain   Ms. Erica Barber is a 23 y.o. G3P2002 at [redacted]w[redacted]d who presents to MAU for abdominal pain.  Onset: Monday 06/24/2019 Location: across lower quadrants of abdomen Duration: 3 days Character: sharp Aggravating/Associated: none/none Relieving: none Treatment: water - does not help Severity: 4/10  Pt denies VB, vaginal discharge/odor/itching. Pt denies N/V, abdominal pain, constipation, diarrhea, or urinary problems. Pt denies fever, chills, fatigue, sweating or changes in appetite. Pt denies SOB or chest pain. Pt denies dizziness, HA, light-headedness, weakness.  Problems this pregnancy include: pt has not yet been seen. Allergies? NKDA Current medications/supplements? none Prenatal care provider? Pt requests list of OB providers   OB History    Gravida  3   Para  2   Term  2   Preterm      AB      Living  2     SAB      TAB      Ectopic      Multiple  0   Live Births  2           Past Medical History:  Diagnosis Date  . Asthma     Past Surgical History:  Procedure Laterality Date  . NO PAST SURGERIES      Family History  Problem Relation Age of Onset  . Hypertension Father   . Hypertension Paternal Grandmother     Social History   Tobacco Use  . Smoking status: Never Smoker  . Smokeless tobacco: Never Used  Substance Use Topics  . Alcohol use: No  . Drug use: Not Currently    Types: Marijuana    Comment: not since she has been pregnant    Allergies: No Known Allergies  Medications Prior to Admission  Medication Sig Dispense Refill Last Dose  . Elastic Bandages & Supports (ABDOMINAL BINDER/ELASTIC LARGE) MISC 1 each by Does not apply route as needed. 1 each 0   . Prenatal MV-Min-FA-Omega-3 (PRENATAL GUMMIES/DHA & FA) 0.4-32.5 MG CHEW  Chew 1 each by mouth at bedtime. 90 tablet 3     Review of Systems  Constitutional: Negative for chills, diaphoresis, fatigue and fever.  Eyes: Negative for visual disturbance.  Respiratory: Negative for shortness of breath.   Cardiovascular: Negative for chest pain.  Gastrointestinal: Positive for abdominal pain. Negative for constipation, diarrhea, nausea and vomiting.  Genitourinary: Negative for dysuria, flank pain, frequency, pelvic pain, urgency, vaginal bleeding and vaginal discharge.  Neurological: Negative for dizziness, weakness, light-headedness and headaches.   Physical Exam   Blood pressure 123/65, pulse 86, temperature 98.6 F (37 C), resp. rate 18, last menstrual period 04/10/2019, unknown if currently breastfeeding.  Patient Vitals for the past 24 hrs:  BP Temp Pulse Resp  06/27/19 1726 123/65 -- -- --  06/27/19 1725 123/65 98.6 F (37 C) 86 18   Physical Exam  Constitutional: She is oriented to person, place, and time. She appears well-developed and well-nourished. No distress.  HENT:  Head: Normocephalic and atraumatic.  Respiratory: Effort normal.  GI: Soft. She exhibits no distension and no mass. There is no abdominal tenderness. There is no rebound and no guarding.  Neurological: She is alert and oriented to person, place, and time.  Skin: Skin is warm and dry. She is not diaphoretic.  Psychiatric: She has a normal mood and affect. Her behavior is normal. Judgment and thought content normal.   Results for orders placed or performed during the hospital encounter of 06/27/19 (from the past 24 hour(s))  Urinalysis, Routine w reflex microscopic     Status: Abnormal   Collection Time: 06/27/19  6:03 PM  Result Value Ref Range   Color, Urine YELLOW YELLOW   APPearance HAZY (A) CLEAR   Specific Gravity, Urine 1.024 1.005 - 1.030   pH 6.0 5.0 - 8.0   Glucose, UA NEGATIVE NEGATIVE mg/dL   Hgb urine dipstick NEGATIVE NEGATIVE   Bilirubin Urine NEGATIVE NEGATIVE    Ketones, ur NEGATIVE NEGATIVE mg/dL   Protein, ur NEGATIVE NEGATIVE mg/dL   Nitrite NEGATIVE NEGATIVE   Leukocytes,Ua NEGATIVE NEGATIVE  CBC     Status: None   Collection Time: 06/27/19  6:34 PM  Result Value Ref Range   WBC 5.5 4.0 - 10.5 K/uL   RBC 4.25 3.87 - 5.11 MIL/uL   Hemoglobin 12.2 12.0 - 15.0 g/dL   HCT 78.2 95.6 - 21.3 %   MCV 87.5 80.0 - 100.0 fL   MCH 28.7 26.0 - 34.0 pg   MCHC 32.8 30.0 - 36.0 g/dL   RDW 08.6 57.8 - 46.9 %   Platelets 173 150 - 400 K/uL   nRBC 0.0 0.0 - 0.2 %  Comprehensive metabolic panel     Status: Abnormal   Collection Time: 06/27/19  6:34 PM  Result Value Ref Range   Sodium 137 135 - 145 mmol/L   Potassium 3.5 3.5 - 5.1 mmol/L   Chloride 104 98 - 111 mmol/L   CO2 24 22 - 32 mmol/L   Glucose, Bld 79 70 - 99 mg/dL   BUN <5 (L) 6 - 20 mg/dL   Creatinine, Ser 6.29 0.44 - 1.00 mg/dL   Calcium 8.8 (L) 8.9 - 10.3 mg/dL   Total Protein 6.2 (L) 6.5 - 8.1 g/dL   Albumin 3.0 (L) 3.5 - 5.0 g/dL   AST 12 (L) 15 - 41 U/L   ALT 7 0 - 44 U/L   Alkaline Phosphatase 36 (L) 38 - 126 U/L   Total Bilirubin 0.6 0.3 - 1.2 mg/dL   GFR calc non Af Amer >60 >60 mL/min   GFR calc Af Amer >60 >60 mL/min   Anion gap 9 5 - 15  hCG, quantitative, pregnancy     Status: Abnormal   Collection Time: 06/27/19  6:34 PM  Result Value Ref Range   hCG, Beta Chain, Quant, S 52,841 (H) <5 mIU/mL  Wet prep, genital     Status: Abnormal   Collection Time: 06/27/19  6:55 PM  Result Value Ref Range   Yeast Wet Prep HPF POC NONE SEEN NONE SEEN   Trich, Wet Prep NONE SEEN NONE SEEN   Clue Cells Wet Prep HPF POC PRESENT (A) NONE SEEN   WBC, Wet Prep HPF POC MODERATE (A) NONE SEEN   Sperm NONE SEEN    No results found.  MAU Course  Procedures  MDM -r/o ectopic -after evaluation, pt reports to RN that she needs something for her HA and nausea; patient rates HA as 6/10 and states has been taking Advil for her HA. Pt denies vomiting. Pt reports hx of migraines HA and  states that the nausea comes along with her HA. Reglan and Tylenol  given for HA. -UA: hazy, otherwise WNL -CBC: WNL -CMP: no abnormalities requiring treatment -[place]: 19w4, placenta posterior, CL  3.7cm and normal in appearance -hCG: pending at time of discharge -ABO: O Positive -WetPrep: +ClueCells (pt reports increase in discharge and "musky" odor, will treat for BV), otherwise WNL -GC/CT collected -discussed round ligament pain as possible cause of sharp, lower abdominal pain -pt discharged to home in stable condition  Orders Placed This Encounter  Procedures  . Wet prep, genital    Standing Status:   Standing    Number of Occurrences:   1  . [place] MFM OB LIMITED    Standing Status:   Standing    Number of Occurrences:   1    Order Specific Question:   Symptom/Reason for Exam    Answer:   Abdominal pain in pregnancy [[id number]]  . Urinalysis, Routine w reflex microscopic    Standing Status:   Standing    Number of Occurrences:   1  . CBC    Standing Status:   Standing    Number of Occurrences:   1  . Comprehensive metabolic panel    Standing Status:   Standing    Number of Occurrences:   1  . hCG, quantitative, pregnancy    Standing Status:   Standing    Number of Occurrences:   1  . Discharge patient    Order Specific Question:   Discharge disposition    Answer:   01-Home or Self Care [1]    Order Specific Question:   Discharge patient date    Answer:   06/27/2019   Meds ordered this encounter  Medications  . acetaminophen (TYLENOL) tablet 1,000 mg  . DISCONTD: metoCLOPramide (REGLAN) injection 10 mg  . metoCLOPramide (REGLAN) 10 MG tablet    Sig: Take 1 tablet (10 mg total) by mouth every 8 (eight) hours as needed.    Dispense:  90 tablet    Refill:  0    Order Specific Question:   Supervising Provider    Answer:   CONSTANT, PEGGY [[id number]]  . metroNIDAZOLE (FLAGYL) 500 MG tablet    Sig: Take 1 tablet (500 mg total) by mouth 2 (two) times daily for 7 days.     Dispense:  14 tablet    Refill:  0    Order Specific Question:   Supervising Provider    Answer:   CONSTANT, PEGGY [[id number]]  . metoCLOPramide (REGLAN) tablet 10 mg   Assessment and Plan   1. Round ligament pain   2. Abdominal pain in pregnancy   3. Pregnancy headache in second trimester   4. Nausea   5. Bacterial vaginosis   6. [redacted] weeks gestation of pregnancy    Allergies as of 06/27/2019   No Known Allergies     Medication List    TAKE these medications   Abdominal Binder/Elastic Large Misc 1 each by Does not apply route as needed.   metoCLOPramide 10 MG tablet Commonly known as: REGLAN Take 1 tablet (10 mg total) by mouth every 8 (eight) hours as needed.   metroNIDAZOLE 500 MG tablet Commonly known as: Flagyl Take 1 tablet (500 mg total) by mouth 2 (two) times daily for 7 days.   Prenatal Gummies/DHA & FA 0.4-32.5 MG Chew Chew 1 each by mouth at bedtime.      -pt to start PNVs -list of OB providers given, pt advised to call OB of choice tomorrow @0800AM  to schedule appt. -list of safe meds in pregnancy given, pt advised against taking NSAIDs prior to consulting with provider -RX Reglan to take with Tylenol for migraine  HA in pregnancy -discussed nonpharmacologic and pharmacologic treatments of N/V -discussed normal expectations for N/V in pregnancy -pt discharged to home in stable condition  Erica Barber 06/27/2019, 8:29 PM

## 2019-07-01 LAB — GC/CHLAMYDIA PROBE AMP (~~LOC~~) NOT AT ARMC
Chlamydia: POSITIVE — AB
Comment: NEGATIVE
Comment: NORMAL
Neisseria Gonorrhea: NEGATIVE

## 2019-07-03 ENCOUNTER — Telehealth: Payer: Self-pay | Admitting: Women's Health

## 2019-07-03 ENCOUNTER — Encounter: Payer: Self-pay | Admitting: Women's Health

## 2019-07-03 DIAGNOSIS — O98812 Other maternal infectious and parasitic diseases complicating pregnancy, second trimester: Secondary | ICD-10-CM

## 2019-07-03 DIAGNOSIS — A749 Chlamydial infection, unspecified: Secondary | ICD-10-CM

## 2019-07-03 MED ORDER — AZITHROMYCIN 500 MG PO TABS: 1000.0000 mg | ORAL_TABLET | Freq: Every day | ORAL | 0 refills | Status: AC

## 2019-07-03 NOTE — Telephone Encounter (Signed)
Pt returned phone call to MAU and was identified by two identifiers. Pt notified of positive chlamydia swab and need for treatment. Pt advised that partner should be tested and treated as well. Pt advised to refrain from intercourse from now, until 7days after her partner is treated. Pt advised all partner in the past 60days should be tested and treated. Pt confirms NKDA and is not taking any medications at this time. RX Azithromycin sent.  Clarisa Fling, NP  2:08 PM 07/03/2019

## 2019-07-03 NOTE — Telephone Encounter (Signed)
Left VM requesting return call to MAU provider's office before 8pm tonight re: +CT.  Clarisa Fling, NP  9:01 AM 07/03/2019

## 2019-07-04 ENCOUNTER — Other Ambulatory Visit: Payer: Self-pay | Admitting: Student

## 2019-07-04 ENCOUNTER — Telehealth: Payer: Self-pay | Admitting: Student

## 2019-07-04 MED ORDER — AZITHROMYCIN 500 MG PO TABS: 1000.0000 mg | ORAL_TABLET | Freq: Every day | ORAL | 0 refills | Status: DC

## 2019-07-04 NOTE — Telephone Encounter (Signed)
Patient called MAU and stated that she threw up after taking her zithromax. Chart review shows she was given RX for chlamydia yesterday.  I recommended that she eat and then take her medicine 30 minutes after eating. She reports that the baby is "moving a lot"; she wants to know what that means. I explained that sounds like normal fetal movements, but if she starts having increased pain or cramping she should come back to the MAU. New RX sent and patient will pick up.  Maye Hides

## 2019-07-15 ENCOUNTER — Encounter: Payer: Self-pay | Admitting: Obstetrics & Gynecology

## 2019-07-15 ENCOUNTER — Other Ambulatory Visit: Payer: Self-pay

## 2019-07-15 ENCOUNTER — Ambulatory Visit (INDEPENDENT_AMBULATORY_CARE_PROVIDER_SITE_OTHER): Payer: Medicaid Other | Admitting: Obstetrics & Gynecology

## 2019-07-15 VITALS — BP 117/74 | HR 84 | Wt 228.0 lb

## 2019-07-15 DIAGNOSIS — Z3481 Encounter for supervision of other normal pregnancy, first trimester: Secondary | ICD-10-CM | POA: Diagnosis not present

## 2019-07-15 DIAGNOSIS — Z124 Encounter for screening for malignant neoplasm of cervix: Secondary | ICD-10-CM

## 2019-07-15 DIAGNOSIS — Z3490 Encounter for supervision of normal pregnancy, unspecified, unspecified trimester: Secondary | ICD-10-CM

## 2019-07-15 DIAGNOSIS — Z3A22 22 weeks gestation of pregnancy: Secondary | ICD-10-CM

## 2019-07-15 DIAGNOSIS — O98812 Other maternal infectious and parasitic diseases complicating pregnancy, second trimester: Secondary | ICD-10-CM

## 2019-07-15 DIAGNOSIS — A749 Chlamydial infection, unspecified: Secondary | ICD-10-CM

## 2019-07-15 MED ORDER — BLOOD PRESSURE MONITOR KIT: 1.0000 | PACK | 0 refills | Status: AC

## 2019-07-15 MED ORDER — PRENATAL ADULT GUMMY/DHA/FA 0.4-25 MG PO CHEW: 1.0000 | CHEWABLE_TABLET | Freq: Every day | ORAL | 4 refills | Status: DC

## 2019-07-15 MED ORDER — PRENATAL GUMMIES/DHA & FA 0.4-32.5 MG PO CHEW: 1.0000 | CHEWABLE_TABLET | Freq: Every day | ORAL | 3 refills | Status: DC

## 2019-07-15 NOTE — Progress Notes (Signed)
NOB  Genetic Screening: Declines Panorama  GC/CT : + on 06/27/19 FLU: Declined  U/2 :06/27/19 CC: prenatals vitamins  are making pt sick.  Pt left w/o leaving urine sample.

## 2019-07-15 NOTE — Patient Instructions (Signed)

## 2019-07-15 NOTE — Progress Notes (Signed)
NOB Subjective:    ANJA NEUZIL is a [id number] [redacted]w[redacted]d being seen today for her first obstetrical visit.  Her obstetrical history is significant for obesity. Patient does intend to breast feed. Pregnancy history fully reviewed.  Patient reports no complaints.  Vitals:   07/15/19 1314  BP: 117/74  Pulse: 84  Weight: 228 lb (103.4 kg)    HISTORY: OB History  Gravida Para Term Preterm AB Living  3 2 2     2   SAB TAB Ectopic Multiple Live Births        0 2    # Outcome Date GA Lbr Len/2nd Weight Sex Delivery Anes PTL Lv  3 Current           2 Term 06/10/18 [redacted]w[redacted]d  7 lb 12.2 oz (3.521 kg) F Vag-Spont EPI  LIV  1 Term 04/10/16 [redacted]w[redacted]d 11:16 / 02:19 8 lb 9.9 oz (3.91 kg) M Vag-Spont EPI  LIV   Past Medical History:  Diagnosis Date  . Asthma    Past Surgical History:  Procedure Laterality Date  . NO PAST SURGERIES     Family History  Problem Relation Age of Onset  . Hypertension Father   . Hypertension Paternal Grandmother      Exam    Uterus:     Pelvic Exam:    Perineum: No Hemorrhoids   Vulva: normal   Vagina:  normal mucosa   pH:     Cervix: no lesions   Adnexa: not evaluated   Bony Pelvis: average  System: Breast:  normal appearance, no masses or tenderness   Skin: normal coloration and turgor, no rashes    Neurologic: oriented, normal mood   Extremities: normal strength, tone, and muscle mass   HEENT PERRLA   Mouth/Teeth mucous membranes moist, pharynx normal without lesions and dental hygiene good   Neck supple and no masses   Cardiovascular: regular rate and rhythm, no murmurs or gallops   Respiratory:  appears well, vitals normal, no respiratory distress, acyanotic, normal RR, neck free of mass or lymphadenopathy, chest clear, no wheezing, crepitations, rhonchi, normal symmetric air entry   Abdomen: soft, non-tender; bowel sounds normal; no masses,  no organomegaly   Urinary: urethral meatus normal      Assessment:    Pregnancy: [id number] Patient  Active Problem List   Diagnosis Date Noted  . Supervision of normal pregnancy, antepartum 07/15/2019  . Chlamydia infection affecting pregnancy 07/03/2019  . NSVD (normal spontaneous vaginal delivery) 06/10/2018  . Shoulder dystocia during labor and delivery, delivered 06/10/2018  . Normal labor 06/09/2018  . Group beta Strep positive 04/11/2016  . Anemia 04/11/2016  . Vaginal delivery 04/10/2016  . Asthma 10/16/2015        Plan:     Initial labs drawn. Prenatal vitamins. Problem list reviewed and updated. Genetic Screening discussed declined.  Ultrasound discussed; fetal survey: ordered.  Follow up in 4 weeks. 50% of 30 min visit spent on counseling and coordination of care.     Emeterio Reeve 07/15/2019

## 2019-07-16 ENCOUNTER — Encounter: Payer: Self-pay | Admitting: Obstetrics & Gynecology

## 2019-07-17 LAB — AFP, SERUM, OPEN SPINA BIFIDA
AFP MoM: 1.37
AFP Value: 86.8 ng/mL
Gest. Age on Collection Date: 22 weeks
Maternal Age At EDD: 23.4 yr
OSBR Risk 1 IN: 7840
Test Results:: NEGATIVE
Weight: 228 [lb_av]

## 2019-07-17 LAB — OBSTETRIC PANEL, INCLUDING HIV
Antibody Screen: NEGATIVE
Basophils Absolute: 0 10*3/uL (ref 0.0–0.2)
Basos: 0 %
EOS (ABSOLUTE): 0 10*3/uL (ref 0.0–0.4)
Eos: 0 %
HIV Screen 4th Generation wRfx: NONREACTIVE
Hematocrit: 35.4 % (ref 34.0–46.6)
Hemoglobin: 11.7 g/dL (ref 11.1–15.9)
Hepatitis B Surface Ag: NEGATIVE
Immature Grans (Abs): 0 10*3/uL (ref 0.0–0.1)
Immature Granulocytes: 0 %
Lymphocytes Absolute: 1.2 10*3/uL (ref 0.7–3.1)
Lymphs: 19 %
MCH: 28.7 pg (ref 26.6–33.0)
MCHC: 33.1 g/dL (ref 31.5–35.7)
MCV: 87 fL (ref 79–97)
Monocytes Absolute: 0.5 10*3/uL (ref 0.1–0.9)
Monocytes: 8 %
Neutrophils Absolute: 4.5 10*3/uL (ref 1.4–7.0)
Neutrophils: 73 %
Platelets: 172 10*3/uL (ref 150–450)
RBC: 4.07 x10E6/uL (ref 3.77–5.28)
RDW: 12.7 % (ref 11.7–15.4)
RPR Ser Ql: NONREACTIVE
Rh Factor: POSITIVE
Rubella Antibodies, IGG: 8.27 index (ref >0.99)
WBC: 6.1 10*3/uL (ref 3.4–10.8)

## 2019-07-18 ENCOUNTER — Encounter: Payer: Self-pay | Admitting: Obstetrics & Gynecology

## 2019-07-19 NOTE — L&D Delivery Note (Signed)
[facility]N [facility] Delivery Note  Erica Barber is a 24 y.o. [id number] s/p vaginal delivery at [redacted]w[redacted]d. She was admitted for post dates IOL.   ROM: 3h 16m with light meconium-stained fluid fluid GBS Status: negative Maximum Maternal Temperature: 98*F  Labor Progress: . Admitted for PD IOL at 41 week. Started on Pitocin. Eventually AROMed. She progressed to complete and delivered shortly thereafter.  Delivery Date/Time: 11/24/19, 1839 Delivery: Called to room and patient was complete and pushing. Head delivered LOA. No nuchal cord present. Shoulder and body delivered in usual fashion. Infant with spontaneous cry, placed on mother's abdomen, dried and stimulated. Cord clamped x 2 and cut at around 20 seconds of life due to maternal respiratory distress. Respiratory therapy called for STAT nebulizer treatment. Cord blood drawn. Placenta delivered spontaneously with gentle cord traction. Fundus firm with massage and Pitocin. Labia, perineum, vagina, and cervix were inspected, and found to be in tact with a mild left periurethral abrasion- hemostatic, not needing repair.   Placenta: 3 vessel cord, intact, to L&D Complications: asthma exacerbation during delivery, given albuterol nebulizer Lacerations: mild left periurethral abrasion- hemostatic, not needing repair EBL: 300 Analgesia: epidural  Postpartum Planning [x]  message to sent to schedule follow-up  [x]  vaccines UTD  Infant: female  APGARs 8, 9  weight per medical record  , DO OB/GYN Fellow, Faculty Practice

## 2019-08-01 ENCOUNTER — Telehealth: Payer: Self-pay

## 2019-08-01 ENCOUNTER — Other Ambulatory Visit: Payer: Self-pay | Admitting: Obstetrics

## 2019-08-01 DIAGNOSIS — G44001 Cluster headache syndrome, unspecified, intractable: Secondary | ICD-10-CM

## 2019-08-01 MED ORDER — BUTALBITAL-APAP-CAFFEINE 50-325-40 MG PO TABS: 1.0000 | ORAL_TABLET | Freq: Four times a day (QID) | ORAL | 2 refills | Status: DC | PRN

## 2019-08-01 NOTE — Telephone Encounter (Signed)
Fioricet Rx

## 2019-08-01 NOTE — Telephone Encounter (Signed)
Pt made aware of new rx sent to pharmacy for HA, pt voices understanding.

## 2019-08-01 NOTE — Telephone Encounter (Signed)
Pt called reporting she is having migraines. She denies blurry vision or abdominal pain, reports good fetal movement. Pt reports she was given Reglan to take with Tylenol for headaches when she went to MAU last month. Pt reports the reglan and tylenol are not helping, pt is requesting a different medication for migraines. I advised pt I will send her request to the provider and let her know when I hear back. Pt voices understanding.

## 2019-08-12 ENCOUNTER — Other Ambulatory Visit: Payer: Medicaid Other

## 2019-08-12 ENCOUNTER — Other Ambulatory Visit: Payer: Self-pay

## 2019-08-12 ENCOUNTER — Other Ambulatory Visit (HOSPITAL_COMMUNITY)
Admission: RE | Admit: 2019-08-12 | Discharge: 2019-08-12 | Disposition: A | Discharge status: Home / Self Care | Payer: Medicaid Other | Source: Ambulatory Visit | Attending: Obstetrics and Gynecology | Admitting: Obstetrics and Gynecology

## 2019-08-12 ENCOUNTER — Encounter: Payer: Self-pay | Admitting: Obstetrics

## 2019-08-12 ENCOUNTER — Encounter: Payer: Self-pay | Admitting: Obstetrics and Gynecology

## 2019-08-12 ENCOUNTER — Ambulatory Visit (INDEPENDENT_AMBULATORY_CARE_PROVIDER_SITE_OTHER): Payer: Medicaid Other | Admitting: Obstetrics and Gynecology

## 2019-08-12 VITALS — BP 126/71 | HR 106 | Wt 231.0 lb

## 2019-08-12 DIAGNOSIS — Z3482 Encounter for supervision of other normal pregnancy, second trimester: Secondary | ICD-10-CM

## 2019-08-12 DIAGNOSIS — Z3480 Encounter for supervision of other normal pregnancy, unspecified trimester: Secondary | ICD-10-CM | POA: Insufficient documentation

## 2019-08-12 DIAGNOSIS — Z3A26 26 weeks gestation of pregnancy: Secondary | ICD-10-CM

## 2019-08-12 NOTE — Addendum Note (Signed)
Addended by: Catalina Antigua on: 08/12/2019 10:02 AM   Modules accepted: Orders

## 2019-08-12 NOTE — Progress Notes (Signed)
Pt states she did have some spotting 3 days ago, now resolved.

## 2019-08-12 NOTE — Progress Notes (Signed)
   PRENATAL VISIT NOTE  Subjective:  Erica Barber is a 24 y.o. G3P2002 at [redacted]w[redacted]d being seen today for ongoing prenatal care.  She is currently monitored for the following issues for this low-risk pregnancy and has Asthma; Anemia; Chlamydia infection affecting pregnancy; and Supervision of normal pregnancy, antepartum on their problem list.  Patient reports no complaints.  Contractions: Not present. Vag. Bleeding: None.  Movement: Present. Denies leaking of fluid.   The following portions of the patient's history were reviewed and updated as appropriate: allergies, current medications, past family history, past medical history, past social history, past surgical history and problem list.   Objective:   Vitals:   08/12/19 0935  BP: 126/71  Pulse: (!) 106  Weight: 231 lb (104.8 kg)    Fetal Status: Fetal Heart Rate (bpm): 155 Fundal Height: 27 cm Movement: Present     General:  Alert, oriented and cooperative. Patient is in no acute distress.  Skin: Skin is warm and dry. No rash noted.   Cardiovascular: Normal heart rate noted  Respiratory: Normal respiratory effort, no problems with respiration noted  Abdomen: Soft, gravid, appropriate for gestational age.  Pain/Pressure: Absent     Pelvic: Cervical exam deferred        Extremities: Normal range of motion.     Mental Status: Normal mood and affect. Normal behavior. Normal judgment and thought content.   Assessment and Plan:  Pregnancy: G3P2002 at [redacted]w[redacted]d 1. Supervision of other normal pregnancy, antepartum Patient reports post coital spotting 3 days ago, nothing since. Patient advised to monitor and to contact office if bleeding persists Third trimester labs today TOC today Patient desires BTL- consent signed today - Glucose Tolerance, 2 Hours w/1 Hour - CBC - HIV Antibody (routine testing w rflx) - RPR - Cervicovaginal ancillary only( [place])  Preterm labor symptoms and general obstetric precautions including but not  limited to vaginal bleeding, contractions, leaking of fluid and fetal movement were reviewed in detail with the patient. Please refer to After Visit Summary for other counseling recommendations.   No follow-ups on file.  No future appointments.  Catalina Antigua, MD

## 2019-08-13 ENCOUNTER — Telehealth: Payer: Self-pay

## 2019-08-13 LAB — CBC
Hematocrit: 34.8 % (ref 34.0–46.6)
Hemoglobin: 11.6 g/dL (ref 11.1–15.9)
MCH: 29.1 pg (ref 26.6–33.0)
MCHC: 33.3 g/dL (ref 31.5–35.7)
MCV: 87 fL (ref 79–97)
Platelets: 196 10*3/uL (ref 150–450)
RBC: 3.98 x10E6/uL (ref 3.77–5.28)
RDW: 12.8 % (ref 11.7–15.4)
WBC: 7 10*3/uL (ref 3.4–10.8)

## 2019-08-13 LAB — CERVICOVAGINAL ANCILLARY ONLY
Chlamydia: NEGATIVE
Comment: NEGATIVE
Comment: NORMAL
Neisseria Gonorrhea: NEGATIVE

## 2019-08-13 LAB — HIV ANTIBODY (ROUTINE TESTING W REFLEX): HIV Screen 4th Generation wRfx: NONREACTIVE

## 2019-08-13 LAB — GLUCOSE TOLERANCE, 2 HOURS W/ 1HR
Glucose, 1 hour: 85 mg/dL (ref 65–179)
Glucose, 2 hour: 89 mg/dL (ref 65–152)
Glucose, Fasting: 87 mg/dL (ref 65–91)

## 2019-08-13 LAB — RPR: RPR Ser Ql: NONREACTIVE

## 2019-08-13 NOTE — Telephone Encounter (Signed)
Return call to pt regarding message left on vm abour Rx No detailes were left regarding Rx.  Pt did not answer lmovm

## 2019-08-21 ENCOUNTER — Other Ambulatory Visit (HOSPITAL_COMMUNITY): Payer: Medicaid Other

## 2019-08-26 ENCOUNTER — Telehealth: Payer: Medicaid Other | Admitting: Obstetrics and Gynecology

## 2019-08-29 ENCOUNTER — Encounter: Payer: Self-pay | Admitting: Obstetrics and Gynecology

## 2019-08-29 ENCOUNTER — Telehealth (INDEPENDENT_AMBULATORY_CARE_PROVIDER_SITE_OTHER): Payer: Medicaid Other | Admitting: Obstetrics and Gynecology

## 2019-08-29 DIAGNOSIS — Z3480 Encounter for supervision of other normal pregnancy, unspecified trimester: Secondary | ICD-10-CM

## 2019-08-29 DIAGNOSIS — O98819 Other maternal infectious and parasitic diseases complicating pregnancy, unspecified trimester: Secondary | ICD-10-CM

## 2019-08-29 DIAGNOSIS — Z3A28 28 weeks gestation of pregnancy: Secondary | ICD-10-CM

## 2019-08-29 DIAGNOSIS — A749 Chlamydial infection, unspecified: Secondary | ICD-10-CM

## 2019-08-29 NOTE — Progress Notes (Signed)
Virtual ROB   CC: None  

## 2019-08-29 NOTE — Progress Notes (Signed)
   TELEHEALTH OBSTETRICS PRENATAL VIRTUAL VIDEO VISIT ENCOUNTER NOTE  Provider location: [facility] [place]   I connected with Erica Barber on 08/29/19 at 11:00 AM EST by MyChart Video Encounter at home and verified that I am speaking with the correct person using two identifiers.   I discussed the limitations, risks, security and privacy concerns of performing an evaluation and management service virtually and the availability of in person appointments. I also discussed with the patient that there may be a patient responsible charge related to this service. The patient expressed understanding and agreed to proceed. Subjective:  Erica Barber is a 24 y.o. G3P2002 at [redacted]w[redacted]d being seen today for ongoing prenatal care.  She is currently monitored for the following issues for this low-risk pregnancy and has Asthma; Anemia; Chlamydia infection affecting pregnancy; and Supervision of normal pregnancy, antepartum on their problem list.  Patient reports no complaints.  Contractions: Irritability. Vag. Bleeding: None.  Movement: Present. Denies any leaking of fluid.   The following portions of the patient's history were reviewed and updated as appropriate: allergies, current medications, past family history, past medical history, past social history, past surgical history and problem list.   Objective:  There were no vitals filed for this visit.  Fetal Status:     Movement: Present     General:  Alert, oriented and cooperative. Patient is in no acute distress.  Respiratory: Normal respiratory effort, no problems with respiration noted  Mental Status: Normal mood and affect. Normal behavior. Normal judgment and thought content.  Rest of physical exam deferred due to type of encounter  Imaging: No results found.  Assessment and Plan:  Pregnancy: G3P2002 at [redacted]w[redacted]d 1. Supervision of other normal pregnancy, antepartum Patient is doing well without complaints Normal glucola  last visit Follow up ultrasound 2/24  2. Chlamydia infection affecting pregnancy, antepartum Negative test of cure in January  Preterm labor symptoms and general obstetric precautions including but not limited to vaginal bleeding, contractions, leaking of fluid and fetal movement were reviewed in detail with the patient. I discussed the assessment and treatment plan with the patient. The patient was provided an opportunity to ask questions and all were answered. The patient agreed with the plan and demonstrated an understanding of the instructions. The patient was advised to call back or seek an in-person office evaluation/go to MAU at [facility] for any urgent or concerning symptoms. Please refer to After Visit Summary for other counseling recommendations.   I provided 11 minutes of face-to-face time during this encounter.  No follow-ups on file.  Future Appointments  Date Time Provider Department Center  09/11/2019 10:45 AM WH-MFC [place] 2 WH-MFCUS MFC-US    Catalina Antigua, MD Center for [facility], [facility]

## 2019-09-11 ENCOUNTER — Other Ambulatory Visit (HOSPITAL_COMMUNITY): Payer: Medicaid Other

## 2019-09-12 ENCOUNTER — Telehealth (INDEPENDENT_AMBULATORY_CARE_PROVIDER_SITE_OTHER): Payer: Medicaid Other | Admitting: Obstetrics

## 2019-09-12 ENCOUNTER — Encounter: Payer: Self-pay | Admitting: Obstetrics

## 2019-09-12 DIAGNOSIS — Z3A30 30 weeks gestation of pregnancy: Secondary | ICD-10-CM

## 2019-09-12 DIAGNOSIS — Z3480 Encounter for supervision of other normal pregnancy, unspecified trimester: Secondary | ICD-10-CM

## 2019-09-12 DIAGNOSIS — O98813 Other maternal infectious and parasitic diseases complicating pregnancy, third trimester: Secondary | ICD-10-CM

## 2019-09-12 DIAGNOSIS — A749 Chlamydial infection, unspecified: Secondary | ICD-10-CM

## 2019-09-12 NOTE — Progress Notes (Signed)
S/w pt for virtual visit, reports fetal movement, no complaints.

## 2019-09-12 NOTE — Progress Notes (Signed)
Subjective:  Erica Barber is a 24 y.o. G3P2002 at [redacted]w[redacted]d being seen today for ongoing prenatal care.  She is currently monitored for the following issues for this low-risk pregnancy and has Asthma; Anemia; Chlamydia infection affecting pregnancy; and Supervision of normal pregnancy, antepartum on their problem list.  Patient reports no complaints.   .  .   . Denies leaking of fluid.   The following portions of the patient's history were reviewed and updated as appropriate: allergies, current medications, past family history, past medical history, past social history, past surgical history and problem list. Problem list updated.  Objective:  There were no vitals filed for this visit.  Fetal Status:           General:  Alert, oriented and cooperative. Patient is in no acute distress.  Skin: Skin is warm and dry. No rash noted.   Cardiovascular: Normal heart rate noted  Respiratory: Normal respiratory effort, no problems with respiration noted  Abdomen: Soft, gravid, appropriate for gestational age.       Pelvic:  Cervical exam deferred        Extremities: Normal range of motion.     Mental Status: Normal mood and affect. Normal behavior. Normal judgment and thought content.   Urinalysis:      Assessment and Plan:  Pregnancy: G3P2002 at [redacted]w[redacted]d  1. Supervision of other normal pregnancy, antepartum  2. Chlamydia infection affecting pregnancy, antepartum   Preterm labor symptoms and general obstetric precautions including but not limited to vaginal bleeding, contractions, leaking of fluid and fetal movement were reviewed in detail with the patient. Please refer to After Visit Summary for other counseling recommendations.   Return in about 2 weeks (around 09/26/2019) for MyChart.   Brock Bad, MD  09/12/2019

## 2019-09-19 ENCOUNTER — Ambulatory Visit (HOSPITAL_COMMUNITY)
Admission: RE | Admit: 2019-09-19 | Discharge: 2019-09-19 | Disposition: A | Discharge status: Home / Self Care | Payer: Medicaid Other | Source: Ambulatory Visit | Attending: Obstetrics and Gynecology | Admitting: Obstetrics and Gynecology

## 2019-09-19 ENCOUNTER — Other Ambulatory Visit: Payer: Self-pay

## 2019-09-19 DIAGNOSIS — Z3480 Encounter for supervision of other normal pregnancy, unspecified trimester: Secondary | ICD-10-CM

## 2019-09-19 DIAGNOSIS — Z3A31 31 weeks gestation of pregnancy: Secondary | ICD-10-CM

## 2019-09-19 DIAGNOSIS — Z363 Encounter for antenatal screening for malformations: Secondary | ICD-10-CM

## 2019-09-19 DIAGNOSIS — O0933 Supervision of pregnancy with insufficient antenatal care, third trimester: Secondary | ICD-10-CM | POA: Diagnosis not present

## 2019-09-20 ENCOUNTER — Other Ambulatory Visit (HOSPITAL_COMMUNITY): Payer: Self-pay | Admitting: *Deleted

## 2019-09-20 DIAGNOSIS — Z362 Encounter for other antenatal screening follow-up: Secondary | ICD-10-CM

## 2019-09-26 ENCOUNTER — Encounter: Payer: Self-pay | Admitting: Obstetrics

## 2019-09-26 ENCOUNTER — Telehealth (INDEPENDENT_AMBULATORY_CARE_PROVIDER_SITE_OTHER): Payer: Medicaid Other | Admitting: Obstetrics

## 2019-09-26 DIAGNOSIS — Z3A32 32 weeks gestation of pregnancy: Secondary | ICD-10-CM

## 2019-09-26 DIAGNOSIS — A749 Chlamydial infection, unspecified: Secondary | ICD-10-CM

## 2019-09-26 DIAGNOSIS — Z3490 Encounter for supervision of normal pregnancy, unspecified, unspecified trimester: Secondary | ICD-10-CM

## 2019-09-26 DIAGNOSIS — O98819 Other maternal infectious and parasitic diseases complicating pregnancy, unspecified trimester: Secondary | ICD-10-CM

## 2019-09-26 NOTE — Progress Notes (Signed)
TELEHEALTH OBSTETRICS PRENATAL VIRTUAL VIDEO VISIT ENCOUNTER NOTE  Provider location: [facility] [employer] [facility] at [place]   I connected with Erica Barber on 09/26/19 at  9:15 AM EST by OB MyChart Video Encounter at home and verified that I am speaking with the correct person using two identifiers.   I discussed the limitations, risks, security and privacy concerns of performing an evaluation and management service virtually and the availability of in person appointments. I also discussed with the patient that there may be a patient responsible charge related to this service. The patient expressed understanding and agreed to proceed. Subjective:  Erica Barber is a 24 y.o. G3P2002 at [redacted]w[redacted]d being seen today for ongoing prenatal care.  She is currently monitored for the following issues for this low-risk pregnancy and has Asthma; Anemia; Chlamydia infection affecting pregnancy; and Supervision of normal pregnancy, antepartum on their problem list.  Patient reports no complaints.  Contractions: Not present. Vag. Bleeding: None.  Movement: Present. Denies any leaking of fluid.   The following portions of the patient's history were reviewed and updated as appropriate: allergies, current medications, past family history, past medical history, past social history, past surgical history and problem list.   Objective:   Vitals:   09/26/19 0918 09/26/19 0919  BP: (!) 146/82 (!) 144/81  Pulse: 90 92    Fetal Status:     Movement: Present     General:  Alert, oriented and cooperative. Patient is in no acute distress.  Respiratory: Normal respiratory effort, no problems with respiration noted  Mental Status: Normal mood and affect. Normal behavior. Normal judgment and thought content.  Rest of physical exam deferred due to type of encounter  Imaging: [place] MFM OB COMP + 14 WK  Result Date: 09/19/2019 ----------------------------------------------------------------------  OBSTETRICS  REPORT                       (Signed Final 09/19/2019 02:02 pm) ---------------------------------------------------------------------- Patient Info  ID #:       [id number]                          D.O.B.:  1996-06-12 (23 yrs)  Name:       Erica Barber                Visit Date: 09/19/2019 01:33 pm ---------------------------------------------------------------------- Performed By  Performed By:     Eden Lathe BS      Ref. Address:     [address]                    [address]                                                             [address]                                                             [phone]  [place]                                                             [id number]  Attending:        Ma Rings MD         [facility]  Referred By:      [facility] ---------------------------------------------------------------------- Orders   #  Description                          Code         Ordered By   1  [place] MFM OB COMP + 14 WK               76805.01     PEGGY CONSTANT  ----------------------------------------------------------------------   #  Order #                    Accession #                 Episode #   1  [id number]                  [id number]                  [id number]  ---------------------------------------------------------------------- Indications   Insufficient Prenatal Care                     O09.30   [redacted] weeks gestation of pregnancy                Z3A.31  ---------------------------------------------------------------------- Fetal Evaluation  Num Of Fetuses:         1  Fetal Heart Rate(bpm):  157  Cardiac Activity:       Observed  AFI Sum(cm)     %Tile       Largest Pocket(cm)  17.7            65          5.85  RUQ(cm)       RLQ(cm)       LUQ(cm)        LLQ(cm)  5.85          5.22          1.83           4.8  ---------------------------------------------------------------------- Biometry  BPD:      79.5  mm     G. Age:  31w 6d         51  %    CI:        69.77   %    70 - 86  FL/HC:      18.8   %    19.1 - 21.3  HC:      303.7  mm     G. Age:  33w 5d         73  %    HC/AC:      1.06        0.96 - 1.17  AC:      286.4  mm     G. Age:  32w 5d         78  %    FL/BPD:     71.9   %    71 - 87  FL:       57.2  mm     G. Age:  30w 0d          6  %    FL/AC:      20.0   %    20 - 24  HUM:      53.1  mm     G. Age:  31w 0d         38  %  Est. FW:    1863  gm      4 lb 2 oz     50  % ---------------------------------------------------------------------- OB History  Gravidity:    3         Term:   2  Living:       2 ---------------------------------------------------------------------- Gestational Age  LMP:           23w 1d        Date:  04/10/19                 EDD:   01/15/20  U/S Today:     32w 1d                                        EDD:   11/13/19  Best:          31w 4d     Det. By:  U/S  (06/27/19)          EDD:   11/17/19 ---------------------------------------------------------------------- Comments  This patient was seen for a detailed fetal anatomy scan as  she has had limited prenatal care during her pregnancy.  The  patient had limited fetal biometry measurements at around 19  weeks when she presented to the MAU which indicated an  [facility] of Nov 17, 2019.  The patient has not presented to our  office prior to today for an ultrasound exam.  The fetal biometry measurements obtained today are  consistent with an [facility] of Nov 17, 2019.  There was normal  amniotic fluid noted.  There were no obvious fetal anomalies noted on today's  ultrasound exam.  However, today's exam was limited due to  her advanced gestational age.  The patient was informed that anomalies may be missed due  to technical limitations. If the fetus is in a suboptimal position  or maternal habitus is  increased, visualization of the fetus in  the maternal uterus may be impaired.  A follow-up exam was scheduled in 4 weeks to confirm her  dates. ----------------------------------------------------------------------                   Johnell Comings, MD Electronically Signed Final Report   09/19/2019 02:02 pm ----------------------------------------------------------------------   Assessment and Plan:  Pregnancy: G3P2002 at [redacted]w[redacted]d 1. Encounter for supervision of normal pregnancy, antepartum, unspecified gravidity  2. Chlamydia infection affecting pregnancy, antepartum   Preterm labor symptoms and general obstetric precautions including but not limited to vaginal bleeding, contractions, leaking of fluid and fetal movement were reviewed in detail with the patient. I discussed the assessment and treatment plan with the patient. The patient was provided an opportunity to ask questions and all were answered. The patient agreed with the plan and demonstrated an understanding of the instructions. The patient was advised to call back or seek an in-person office evaluation/go to MAU at [facility] for any urgent or concerning symptoms. Please refer to After Visit Summary for other counseling recommendations.   I provided 10 minutes of face-to-face time during this encounter.  Return in about 2 weeks (around 10/10/2019) for MyChart.  Future Appointments  Date Time Provider Department Center  10/17/2019  2:00 PM WH-MFC [place] 3 WH-MFCUS MFC-US    Coral Ceo, MD Center for [facility], [facility] 09/26/2019

## 2019-10-10 ENCOUNTER — Telehealth: Payer: Medicaid Other | Admitting: Obstetrics

## 2019-10-10 ENCOUNTER — Telehealth (INDEPENDENT_AMBULATORY_CARE_PROVIDER_SITE_OTHER): Payer: Medicaid Other | Admitting: Family Medicine

## 2019-10-10 ENCOUNTER — Telehealth: Payer: Self-pay

## 2019-10-10 ENCOUNTER — Encounter: Payer: Self-pay | Admitting: Family Medicine

## 2019-10-10 ENCOUNTER — Other Ambulatory Visit: Payer: Self-pay

## 2019-10-10 DIAGNOSIS — Z3490 Encounter for supervision of normal pregnancy, unspecified, unspecified trimester: Secondary | ICD-10-CM

## 2019-10-10 DIAGNOSIS — O98819 Other maternal infectious and parasitic diseases complicating pregnancy, unspecified trimester: Secondary | ICD-10-CM

## 2019-10-10 DIAGNOSIS — O98813 Other maternal infectious and parasitic diseases complicating pregnancy, third trimester: Secondary | ICD-10-CM

## 2019-10-10 DIAGNOSIS — Z3A34 34 weeks gestation of pregnancy: Secondary | ICD-10-CM

## 2019-10-10 DIAGNOSIS — A749 Chlamydial infection, unspecified: Secondary | ICD-10-CM

## 2019-10-10 NOTE — Progress Notes (Signed)
   TELEHEALTH VIRTUAL OBSTETRICS VISIT ENCOUNTER NOTE  I connected with Erica Barber on 10/10/19 at  4:00 PM EDT by telephone at home and verified that I am speaking with the correct person using two identifiers.   I discussed the limitations, risks, security and privacy concerns of performing an evaluation and management service by telephone and the availability of in person appointments. I also discussed with the patient that there may be a patient responsible charge related to this service. The patient expressed understanding and agreed to proceed.  Subjective:  Erica Barber is a 24 y.o. G3P2002 at [redacted]w[redacted]d being followed for ongoing prenatal care.  She is currently monitored for the following issues for this low-risk pregnancy and has Asthma; Anemia; Chlamydia infection affecting pregnancy; and Supervision of normal pregnancy, antepartum on their problem list.  Patient reports more pressure in her pelvis. Reports fetal movement. Denies any contractions, bleeding or leaking of fluid.   The following portions of the patient's history were reviewed and updated as appropriate: allergies, current medications, past family history, past medical history, past social history, past surgical history and problem list.   Objective:   General:  Alert, oriented and cooperative.   Mental Status: Normal mood and affect perceived. Normal judgment and thought content.  Rest of physical exam deferred due to type of encounter  Assessment and Plan:  Pregnancy: G3P2002 at [redacted]w[redacted]d  1. Encounter for supervision of normal pregnancy, antepartum, unspecified gravidity - Continue routine prenatal care - 36 week swabs next visit  2. Chlamydia infection affecting pregnancy, antepartum - 36 week swabs next visit  Preterm labor symptoms and general obstetric precautions including but not limited to vaginal bleeding, contractions, leaking of fluid and fetal movement were reviewed in detail with the patient.  I  discussed the assessment and treatment plan with the patient. The patient was provided an opportunity to ask questions and all were answered. The patient agreed with the plan and demonstrated an understanding of the instructions. The patient was advised to call back or seek an in-person office evaluation/go to MAU at [facility] for any urgent or concerning symptoms. Please refer to After Visit Summary for other counseling recommendations.   I provided 11 minutes of non-face-to-face time during this encounter.  Return in about 2 weeks (around 10/24/2019) for In person, ROB, 36 week swabs.  Future Appointments  Date Time Provider Department Center  10/17/2019  2:00 PM WH-MFC [place] 3 WH-MFCUS MFC-US    Marlowe Alt, DO Center for [employer], [facility]

## 2019-10-10 NOTE — Progress Notes (Signed)
ROB.  C/o pressure, she is unable to check her BP today, she is not at home.

## 2019-10-10 NOTE — Telephone Encounter (Signed)
  Called patient x3, no answer. Left VMM to call Office.

## 2019-10-10 NOTE — Patient Instructions (Signed)

## 2019-10-14 ENCOUNTER — Telehealth: Payer: Self-pay

## 2019-10-14 NOTE — Telephone Encounter (Signed)
Pt called and requested a note for work, called pt back and no answer, LVM for her to call back.

## 2019-10-15 ENCOUNTER — Telehealth (INDEPENDENT_AMBULATORY_CARE_PROVIDER_SITE_OTHER): Payer: Medicaid Other | Admitting: Obstetrics & Gynecology

## 2019-10-15 DIAGNOSIS — Z3490 Encounter for supervision of normal pregnancy, unspecified, unspecified trimester: Secondary | ICD-10-CM

## 2019-10-15 DIAGNOSIS — A749 Chlamydial infection, unspecified: Secondary | ICD-10-CM | POA: Diagnosis not present

## 2019-10-15 DIAGNOSIS — O98819 Other maternal infectious and parasitic diseases complicating pregnancy, unspecified trimester: Secondary | ICD-10-CM | POA: Diagnosis not present

## 2019-10-15 DIAGNOSIS — Z3A35 35 weeks gestation of pregnancy: Secondary | ICD-10-CM | POA: Diagnosis not present

## 2019-10-15 NOTE — Progress Notes (Signed)
S/w pt for virtual visit, pt reports fetal movement with some pressure. 

## 2019-10-15 NOTE — Progress Notes (Signed)
   TELEHEALTH VIRTUAL OBSTETRICS VISIT ENCOUNTER NOTE  I connected with Erica Barber on 10/15/19 at  3:45 PM EDT by telephone at home and verified that I am speaking with the correct person using two identifiers.   I discussed the limitations, risks, security and privacy concerns of performing an evaluation and management service by telephone and the availability of in person appointments. I also discussed with the patient that there may be a patient responsible charge related to this service. The patient expressed understanding and agreed to proceed.  Subjective:  Erica Barber is a 24 y.o. G3P2002 at [redacted]w[redacted]d being followed for ongoing prenatal care.  She is currently monitored for the following issues for this low-risk pregnancy and has Asthma; Anemia; Chlamydia infection affecting pregnancy; and Supervision of normal pregnancy, antepartum on their problem list.  Patient reports no complaints. Reports fetal movement. Denies any contractions, bleeding or leaking of fluid.   The following portions of the patient's history were reviewed and updated as appropriate: allergies, current medications, past family history, past medical history, past social history, past surgical history and problem list.   Objective:   General:  Alert, oriented and cooperative.   Mental Status: Normal mood and affect perceived. Normal judgment and thought content.  Rest of physical exam deferred due to type of encounter  Assessment and Plan:  Pregnancy: G3P2002 at [redacted]w[redacted]d 1. Encounter for supervision of normal pregnancy, antepartum, unspecified gravidity F/u for in person next  2. Chlamydia infection affecting pregnancy, antepartum Repeat test and GBS  Preterm labor symptoms and general obstetric precautions including but not limited to vaginal bleeding, contractions, leaking of fluid and fetal movement were reviewed in detail with the patient.  I discussed the assessment and treatment plan with the patient.  The patient was provided an opportunity to ask questions and all were answered. The patient agreed with the plan and demonstrated an understanding of the instructions. The patient was advised to call back or seek an in-person office evaluation/go to MAU at [facility] for any urgent or concerning symptoms. Please refer to After Visit Summary for other counseling recommendations.   I provided 11 minutes of non-face-to-face time during this encounter.  Return in about 1 week (around 10/22/2019) for GBS.  Future Appointments  Date Time Provider Department Center  10/17/2019  2:00 PM WH-MFC [place] 3 WH-MFCUS MFC-US  10/24/2019  3:45 PM Malachy Chamber, MD [facility] None    Scheryl Darter, MD Center for [facility], [facility]

## 2019-10-15 NOTE — Patient Instructions (Signed)

## 2019-10-17 ENCOUNTER — Other Ambulatory Visit: Payer: Self-pay

## 2019-10-17 ENCOUNTER — Ambulatory Visit (HOSPITAL_COMMUNITY)
Admission: RE | Admit: 2019-10-17 | Discharge: 2019-10-17 | Disposition: A | Discharge status: Home / Self Care | Payer: Medicaid Other | Source: Ambulatory Visit | Attending: Obstetrics | Admitting: Obstetrics

## 2019-10-17 DIAGNOSIS — O99213 Obesity complicating pregnancy, third trimester: Secondary | ICD-10-CM | POA: Diagnosis not present

## 2019-10-17 DIAGNOSIS — O0933 Supervision of pregnancy with insufficient antenatal care, third trimester: Secondary | ICD-10-CM

## 2019-10-17 DIAGNOSIS — Z362 Encounter for other antenatal screening follow-up: Secondary | ICD-10-CM

## 2019-10-17 DIAGNOSIS — Z3A35 35 weeks gestation of pregnancy: Secondary | ICD-10-CM

## 2019-10-17 NOTE — Progress Notes (Signed)
Work pregnancy restriction letter provided for patient. Pt made aware.

## 2019-10-23 ENCOUNTER — Encounter (HOSPITAL_COMMUNITY): Payer: Self-pay | Admitting: Obstetrics and Gynecology

## 2019-10-23 ENCOUNTER — Inpatient Hospital Stay (HOSPITAL_COMMUNITY)
Admission: AD | Admit: 2019-10-23 | Discharge: 2019-10-23 | Disposition: A | Discharge status: Home / Self Care | Payer: Medicaid Other | Attending: Obstetrics and Gynecology | Admitting: Obstetrics and Gynecology

## 2019-10-23 ENCOUNTER — Other Ambulatory Visit: Payer: Self-pay

## 2019-10-23 DIAGNOSIS — Z3A36 36 weeks gestation of pregnancy: Secondary | ICD-10-CM | POA: Insufficient documentation

## 2019-10-23 DIAGNOSIS — O26893 Other specified pregnancy related conditions, third trimester: Secondary | ICD-10-CM | POA: Insufficient documentation

## 2019-10-23 DIAGNOSIS — Z0371 Encounter for suspected problem with amniotic cavity and membrane ruled out: Secondary | ICD-10-CM | POA: Diagnosis not present

## 2019-10-23 LAB — POCT FERN TEST: POCT Fern Test: NEGATIVE

## 2019-10-23 NOTE — MAU Note (Signed)
.   Erica Barber is a 24 y.o. at [redacted]w[redacted]d here in MAU reporting: leakage of fluid that she noticed when she coughed this morning, lower abdominal cramping.  LMP: Onset of complaint: 0600 Pain score: 6 Vitals:   10/23/19 0812  BP: 131/86  Pulse: (!) 110  Resp: 18  Temp: 98.2 F (36.8 C)  SpO2: 100%     FHT:145 Lab orders placed from triage: UA

## 2019-10-23 NOTE — MAU Provider Note (Signed)
First Provider Initiated Contact with Patient 10/23/19 0827    S: Ms. Erica Barber is a 24 y.o. [id number] at [redacted]w[redacted]d  who presents to MAU today complaining of leaking of fluid since early this morning after an episode of coughing. She denies vaginal bleeding. She denies painful contractions but endorses mild lower abdominal cramping. She reports normal fetal movement.    O: BP 128/71 (BP Location: Right Arm)   Pulse (!) 110   Temp 98.2 F (36.8 C) (Oral)   Resp 18   Ht 5\' 3"  (1.6 m)   Wt 104.3 kg   LMP 04/10/2019   SpO2 100%   BMI 40.74 kg/m    GENERAL: Well-developed, well-nourished female in no acute distress.  HEAD: Normocephalic, atraumatic.  CHEST: Normal effort of breathing, regular heart rate ABDOMEN: Soft, nontender, gravid PELVIC: Normal external female genitalia. Vagina is pink and rugated. Cervix with normal contour, no lesions. Normal discharge.  Negative pooling.   Cervical exam:  Dilation: 1 Effacement (%): 50 Cervical Position: Posterior Presentation: Vertex Exam by:: B. Bowen, RN   Fetal Monitoring: Baseline: 145 Variability: Moderate Accelerations: 15 x 15 Decelerations: N/A Contractions: Irregular  Results for orders placed or performed during the hospital encounter of 10/23/19 (from the past 24 hour(s))  POCT fern test     Status: None   Collection Time: 10/23/19  9:02 AM  Result Value Ref Range   POCT Fern Test Negative = intact amniotic membranes    A: SIUP at [redacted]w[redacted]d  Negative pooling Negative fern Intact amniotic sac  P: Discharge home in stable condition   F/U: [facility], next appointment tomorrow 10/24/2019  12/24/2019, CNM 10/23/2019 9:28 AM

## 2019-10-23 NOTE — Discharge Instructions (Signed)

## 2019-10-24 ENCOUNTER — Telehealth (INDEPENDENT_AMBULATORY_CARE_PROVIDER_SITE_OTHER): Payer: Medicaid Other | Admitting: Obstetrics & Gynecology

## 2019-10-24 ENCOUNTER — Encounter: Payer: Self-pay | Admitting: Obstetrics

## 2019-10-24 DIAGNOSIS — Z3483 Encounter for supervision of other normal pregnancy, third trimester: Secondary | ICD-10-CM

## 2019-10-24 DIAGNOSIS — Z3A36 36 weeks gestation of pregnancy: Secondary | ICD-10-CM | POA: Diagnosis not present

## 2019-10-24 NOTE — Progress Notes (Signed)
   TELEHEALTH VIRTUAL OBSTETRICS VISIT ENCOUNTER NOTE  I connected with Erica Barber on 10/24/19 at  3:45 PM EDT by telephone at home and verified that I am speaking with the correct person using two identifiers.   I discussed the limitations, risks, security and privacy concerns of performing an evaluation and management service by telephone and the availability of in person appointments. I also discussed with the patient that there may be a patient responsible charge related to this service. The patient expressed understanding and agreed to proceed.  Subjective:  Erica Barber is a 24 y.o. G3P2002 at [redacted]w[redacted]d being followed for ongoing prenatal care.  She is currently monitored for the following issues for this low-risk pregnancy and has Asthma; Anemia; Chlamydia infection affecting pregnancy; and Supervision of normal pregnancy, antepartum on their problem list.  Patient reports no bleeding, no contractions, no cramping, no leaking and was seen overnight at [facility] for suspected rupture, was cleared and called discharge. . Reports fetal movement. Denies any contractions, bleeding or leaking of fluid.   The following portions of the patient's history were reviewed and updated as appropriate: allergies, current medications, past family history, past medical history, past social history, past surgical history and problem list.   Objective:   General:  Alert, oriented and cooperative.   Mental Status: Normal mood and affect perceived. Normal judgment and thought content.  Rest of physical exam deferred due to type of encounter  Assessment and Plan:  Pregnancy: G3P2002 at [redacted]w[redacted]d There are no diagnoses linked to this encounter. Preterm labor symptoms and general obstetric precautions including but not limited to vaginal bleeding, contractions, leaking of fluid and fetal movement were reviewed in detail with the patient.  I discussed the assessment and treatment plan with the patient. The patient  was provided an opportunity to ask questions and all were answered. The patient agreed with the plan and demonstrated an understanding of the instructions. The patient was advised to call back or seek an in-person office evaluation/go to [facility] at [facility] for any urgent or concerning symptoms. Please refer to After Visit Summary for other counseling recommendations.   I provided 11 minutes of non-face-to-face time during this encounter.  No follow-ups on file.  No future appointments.  Malachy Chamber, MD [facility] for [employer], [facility]

## 2019-10-24 NOTE — Patient Instructions (Addendum)
Third Trimester of Pregnancy  The third trimester is from week 28 through week 40 (months 7 through 9). This trimester is when your unborn baby (fetus) is growing very fast. At the end of the ninth month, the unborn baby is about 20 inches in length. It weighs about 6-10 pounds. Follow these instructions at home: Medicines  Take over-the-counter and prescription medicines only as told by your doctor. Some medicines are safe and some medicines are not safe during pregnancy.  Take a prenatal vitamin that contains at least 600 micrograms (mcg) of folic acid.  If you have trouble pooping (constipation), take medicine that will make your stool soft (stool softener) if your doctor approves. Eating and drinking   Eat regular, healthy meals.  Avoid raw meat and uncooked cheese.  If you get low calcium from the food you eat, talk to your doctor about taking a daily calcium supplement.  Eat four or five small meals rather than three large meals a day.  Avoid foods that are high in fat and sugars, such as fried and sweet foods.  To prevent constipation: ? Eat foods that are high in fiber, like fresh fruits and vegetables, whole grains, and beans. ? Drink enough fluids to keep your pee (urine) clear or pale yellow. Activity  Exercise only as told by your doctor. Stop exercising if you start to have cramps.  Avoid heavy lifting, wear low heels, and sit up straight.  Do not exercise if it is too hot, too humid, or if you are in a place of great height (high altitude).  You may continue to have sex unless your doctor tells you not to. Relieving pain and discomfort  Wear a good support bra if your breasts are tender.  Take frequent breaks and rest with your legs raised if you have leg cramps or low back pain.  Take warm water baths (sitz baths) to soothe pain or discomfort caused by hemorrhoids. Use hemorrhoid cream if your doctor approves.  If you develop puffy, bulging veins (varicose  veins) in your legs: ? Wear support hose or compression stockings as told by your doctor. ? Raise (elevate) your feet for 15 minutes, 3-4 times a day. ? Limit salt in your food. Safety  Wear your seat belt when driving.  Make a list of emergency phone numbers, including numbers for family, friends, the hospital, and police and fire departments. Preparing for your baby's arrival To prepare for the arrival of your baby:  Take prenatal classes.  Practice driving to the hospital.  Visit the hospital and tour the maternity area.  Talk to your work about taking leave once the baby comes.  Pack your hospital bag.  Prepare the baby's room.  Go to your doctor visits.  Buy a rear-facing car seat. Learn how to install it in your car. General instructions  Do not use hot tubs, steam rooms, or saunas.  Do not use any products that contain nicotine or tobacco, such as cigarettes and e-cigarettes. If you need help quitting, ask your doctor.  Do not drink alcohol.  Do not douche or use tampons or scented sanitary pads.  Do not cross your legs for long periods of time.  Do not travel for long distances unless you must. Only do so if your doctor says it is okay.  Visit your dentist if you have not gone during your pregnancy. Use a soft toothbrush to brush your teeth. Be gentle when you floss.  Avoid cat litter boxes and soil   used by cats. These carry germs that can cause birth defects in the baby and can cause a loss of your baby (miscarriage) or stillbirth.  Keep all your prenatal visits as told by your doctor. This is important. Contact a doctor if:  You are not sure if you are in labor or if your water has broken.  You are dizzy.  You have mild cramps or pressure in your lower belly.  You have a nagging pain in your belly area.  You continue to feel sick to your stomach, you throw up, or you have watery poop.  You have bad smelling fluid coming from your vagina.  You have  pain when you pee. Get help right away if:  You have a fever.  You are leaking fluid from your vagina.  You are spotting or bleeding from your vagina.  You have severe belly cramps or pain.  You lose or gain weight quickly.  You have trouble catching your breath and have chest pain.  You notice sudden or extreme puffiness (swelling) of your face, hands, ankles, feet, or legs.  You have not felt the baby move in over an hour.  You have severe headaches that do not go away with medicine.  You have trouble seeing.  You are leaking, or you are having a gush of fluid, from your vagina before you are 37 weeks.  You have regular belly spasms (contractions) before you are 37 weeks. Summary  The third trimester is from week 28 through week 40 (months 7 through 9). This time is when your unborn baby is growing very fast.  Follow your doctor's advice about medicine, food, and activity.  Get ready for the arrival of your baby by taking prenatal classes, getting all the baby items ready, preparing the baby's room, and visiting your doctor to be checked.  Get help right away if you are bleeding from your vagina, or you have chest pain and trouble catching your breath, or if you have not felt your baby move in over an hour. This information is not intended to replace advice given to you by your health care provider. Make sure you discuss any questions you have with your health care provider. Document Revised: 10/25/2018 Document Reviewed: 08/09/2016 Elsevier Patient Education  2020 [employer].   Preparing for Vaginal Birth After Cesarean Delivery Vaginal birth after cesarean delivery (VBAC) is giving birth vaginally after previously delivering a baby through a cesarean section (C-section). You and your health are provider will discuss your options and whether you may be a good candidate for VBAC. What are my options? After a cesarean delivery, your options for future deliveries may  include:  Scheduled repeat cesarean delivery. This is done in a hospital with an operating room.  Trial of labor after cesarean (TOLAC). A successful TOLAC results in a vaginal delivery. If it is not successful, you will need to have a cesarean delivery. TOLAC should be attempted in facilities where an emergency cesarean delivery can be performed. It should not be done as a home birth. Talk with your health care provider about the risks and benefits of each option early in your pregnancy. The best option for you will depend on your preferences and your overall health as well as your baby's. What should I know about my past cesarean delivery? It is important to know what type of incision was made in your uterus in a past cesarean delivery. The type of incision can affect the success of your TOLAC.  Types of incisions include:  Low transverse. This is a side-to-side cut low on your uterus. The scar on your skin looks like a horizontal line just above your pubic area. This type of cut is the most common and makes you a good candidate for TOLAC.  Low vertical. This is an up-and-down cut low on your uterus. The scar on your skin looks like a vertical line between your pubic area and belly button. This type of cut puts you at higher risk for problems during TOLAC.  High vertical or classical. This is an up-and-down cut high on your uterus. The scar on your skin looks like a vertical line that runs over the top of your belly button. This type of cut has the highest risk for problems and usually means that TOLAC is not an option. When is VBAC not an option? As you progress through your pregnancy, circumstances may change and you may need to reconsider your options. Your situation may also change even as you begin TOLAC. Your health care provider may not want you to attempt a VBAC if you:  Need to have labor started (induced) because your cervix is not ready for labor.  Have never had a vaginal  delivery.  Have had more than two cesarean deliveries.  Are overdue.  Are pregnant with a very large baby.  Have a condition that causes high blood pressure (preeclampsia). Questions to ask your health care provider  Am I a good candidate for TOLAC?  What are my chances of a successful vaginal delivery?  Is my preferred birth location equipped for a TOLAC?  What are my pain management options during a TOLAC? Where to find more information  American Congress of Obstetricians and Gynecologists: www.acog.[place]: www.midwife.org Summary  Vaginal birth after cesarean delivery (VBAC) is giving birth vaginally after previously delivering a baby through a cesarean section (C-section).  VBAC may be a safe and appropriate option for you depending on your medical history and other risk factors. Talk with your health care provider about the options available to you, and the risks and benefits of each early in your pregnancy.  TOLAC should be attempted in facilities where emergency cesarean section procedures can be performed. This information is not intended to replace advice given to you by your health care provider. Make sure you discuss any questions you have with your health care provider. Document Revised: 10/30/2018 Document Reviewed: 10/13/2016 Elsevier Patient Education  [place].

## 2019-10-25 ENCOUNTER — Telehealth: Payer: Self-pay | Admitting: *Deleted

## 2019-10-25 NOTE — Telephone Encounter (Signed)
Pt called to office stating she needs a work note.  Attempt to contact pt to discuss. No answer, LM on VM to call back.

## 2019-10-25 NOTE — Telephone Encounter (Signed)
Pt called to office stating she has been having N&V with diarrhea this week. Pt ask if she can have a work note for today since she feels so bad and can't keep much down. Pt was seen in [facility] on 4/7 and had virtual appt on 4/8.  This problem is not noted in her chart.   Advised message to be sent to provider for approval of work note.  Please advise.

## 2019-10-25 NOTE — Telephone Encounter (Signed)
No we didn't discuss this yesterday, she did not have complaints at our visit.

## 2019-10-28 ENCOUNTER — Telehealth: Payer: Self-pay

## 2019-10-28 NOTE — Telephone Encounter (Signed)
Returned call, no answer, left vm 

## 2019-10-30 ENCOUNTER — Other Ambulatory Visit: Payer: Self-pay

## 2019-10-30 ENCOUNTER — Encounter: Payer: Self-pay | Admitting: Obstetrics

## 2019-10-30 ENCOUNTER — Ambulatory Visit (INDEPENDENT_AMBULATORY_CARE_PROVIDER_SITE_OTHER): Payer: Medicaid Other | Admitting: Obstetrics & Gynecology

## 2019-10-30 ENCOUNTER — Other Ambulatory Visit (HOSPITAL_COMMUNITY)
Admission: RE | Admit: 2019-10-30 | Discharge: 2019-10-30 | Disposition: A | Discharge status: Home / Self Care | Payer: Medicaid Other | Source: Ambulatory Visit | Attending: Obstetrics & Gynecology | Admitting: Obstetrics & Gynecology

## 2019-10-30 VITALS — BP 100/66 | HR 92 | Wt 237.7 lb

## 2019-10-30 DIAGNOSIS — Z3490 Encounter for supervision of normal pregnancy, unspecified, unspecified trimester: Secondary | ICD-10-CM | POA: Diagnosis not present

## 2019-10-30 DIAGNOSIS — Z3493 Encounter for supervision of normal pregnancy, unspecified, third trimester: Secondary | ICD-10-CM

## 2019-10-30 DIAGNOSIS — Z3A37 37 weeks gestation of pregnancy: Secondary | ICD-10-CM

## 2019-10-30 NOTE — Progress Notes (Signed)
Pt is here for ROB, [redacted]w[redacted]d.  

## 2019-10-30 NOTE — Patient Instructions (Signed)

## 2019-10-30 NOTE — Progress Notes (Signed)
     PRENATAL VISIT NOTE  Subjective:  Erica Barber is a 24 y.o. G3P2002 at [redacted]w[redacted]d being seen today for ongoing prenatal care.  She is currently monitored for the following issues for this low-risk pregnancy and has Asthma; Anemia; Chlamydia infection affecting pregnancy; and Supervision of normal pregnancy, antepartum on their problem list.  Patient reports no complaints.  Contractions: Irritability. Vag. Bleeding: None.  Movement: Present. Denies leaking of fluid.   The following portions of the patient's history were reviewed and updated as appropriate: allergies, current medications, past family history, past medical history, past social history, past surgical history and problem list.   Objective:   Vitals:   10/30/19 1522  BP: 100/66  Pulse: 92  Weight: 237 lb 11.2 oz (107.8 kg)    Fetal Status: Fetal Heart Rate (bpm): 168   Movement: Present  Presentation: Vertex  General:  Alert, oriented and cooperative. Patient is in no acute distress.  Skin: Skin is warm and dry. No rash noted.   Cardiovascular: Normal heart rate noted  Respiratory: Normal respiratory effort, no problems with respiration noted  Abdomen: Soft, gravid, appropriate for gestational age.  Pain/Pressure: Present     Pelvic: Cervical exam performed in the presence of a chaperone Dilation: 1.5 Effacement (%): 30 Station: -3  Extremities: Normal range of motion.  Edema: None  Mental Status: Normal mood and affect. Normal behavior. Normal judgment and thought content.   Assessment and Plan:  Pregnancy: G3P2002 at [redacted]w[redacted]d 1. Encounter for supervision of normal pregnancy, antepartum, unspecified gravidity Routine labs  Orders Placed This Encounter  Procedures  . Strep Gp B NAA    Preterm labor symptoms and general obstetric precautions including but not limited to vaginal bleeding, contractions, leaking of fluid and fetal movement were reviewed in detail with the patient. Please refer to After Visit Summary  for other counseling recommendations.   Return in about 1 week (around 11/06/2019).  Future Appointments  Date Time Provider Department Center  11/05/2019 11:15 AM Adam Phenix, MD [facility] None    Scheryl Darter, MD

## 2019-10-31 LAB — CERVICOVAGINAL ANCILLARY ONLY
Chlamydia: NEGATIVE
Comment: NEGATIVE
Comment: NORMAL
Neisseria Gonorrhea: NEGATIVE

## 2019-11-01 LAB — STREP GP B NAA: Strep Gp B NAA: NEGATIVE

## 2019-11-05 ENCOUNTER — Other Ambulatory Visit: Payer: Self-pay

## 2019-11-05 ENCOUNTER — Encounter: Payer: Self-pay | Admitting: Obstetrics

## 2019-11-05 ENCOUNTER — Ambulatory Visit (INDEPENDENT_AMBULATORY_CARE_PROVIDER_SITE_OTHER): Payer: Medicaid Other | Admitting: Obstetrics & Gynecology

## 2019-11-05 VITALS — BP 115/74 | HR 97 | Wt 238.0 lb

## 2019-11-05 DIAGNOSIS — Z3490 Encounter for supervision of normal pregnancy, unspecified, unspecified trimester: Secondary | ICD-10-CM

## 2019-11-05 DIAGNOSIS — Z3A38 38 weeks gestation of pregnancy: Secondary | ICD-10-CM

## 2019-11-05 DIAGNOSIS — Z3493 Encounter for supervision of normal pregnancy, unspecified, third trimester: Secondary | ICD-10-CM

## 2019-11-05 NOTE — Progress Notes (Signed)
   PRENATAL VISIT NOTE  Subjective:  Erica Barber is a 24 y.o. G3P2002 at [redacted]w[redacted]d being seen today for ongoing prenatal care.  She is currently monitored for the following issues for this low-risk pregnancy and has Asthma; Anemia; Chlamydia infection affecting pregnancy; and Supervision of normal pregnancy, antepartum on their problem list.  Patient reports no complaints.  Contractions: Not present. Vag. Bleeding: None.  Movement: Present. Denies leaking of fluid.   The following portions of the patient's history were reviewed and updated as appropriate: allergies, current medications, past family history, past medical history, past social history, past surgical history and problem list.   Objective:   Vitals:   11/05/19 1127  BP: 115/74  Pulse: 97  Weight: 238 lb (108 kg)    Fetal Status: Fetal Heart Rate (bpm): 150   Movement: Present     General:  Alert, oriented and cooperative. Patient is in no acute distress.  Skin: Skin is warm and dry. No rash noted.   Cardiovascular: Normal heart rate noted  Respiratory: Normal respiratory effort, no problems with respiration noted  Abdomen: Soft, gravid, appropriate for gestational age.  Pain/Pressure: Present     Pelvic: Cervical exam deferred        Extremities: Normal range of motion.     Mental Status: Normal mood and affect. Normal behavior. Normal judgment and thought content.   Assessment and Plan:  Pregnancy: G3P2002 at [redacted]w[redacted]d 1. Encounter for supervision of normal pregnancy, antepartum, unspecified gravidity Doing well  Term labor symptoms and general obstetric precautions including but not limited to vaginal bleeding, contractions, leaking of fluid and fetal movement were reviewed in detail with the patient. Please refer to After Visit Summary for other counseling recommendations.   No follow-ups on file.  Future Appointments  Date Time Provider Department Center  11/12/2019  1:45 PM Constant, Gigi Gin, MD [facility] None     Scheryl Darter, MD

## 2019-11-05 NOTE — Patient Instructions (Signed)

## 2019-11-12 ENCOUNTER — Encounter: Payer: Self-pay | Admitting: Obstetrics and Gynecology

## 2019-11-12 ENCOUNTER — Other Ambulatory Visit: Payer: Self-pay

## 2019-11-12 ENCOUNTER — Encounter: Payer: Self-pay | Admitting: Obstetrics

## 2019-11-12 ENCOUNTER — Ambulatory Visit (INDEPENDENT_AMBULATORY_CARE_PROVIDER_SITE_OTHER): Payer: Medicaid Other | Admitting: Obstetrics and Gynecology

## 2019-11-12 VITALS — BP 127/79 | HR 85 | Wt 239.9 lb

## 2019-11-12 DIAGNOSIS — Z3483 Encounter for supervision of other normal pregnancy, third trimester: Secondary | ICD-10-CM

## 2019-11-12 DIAGNOSIS — Z3480 Encounter for supervision of other normal pregnancy, unspecified trimester: Secondary | ICD-10-CM

## 2019-11-12 DIAGNOSIS — Z3A39 39 weeks gestation of pregnancy: Secondary | ICD-10-CM

## 2019-11-12 NOTE — Progress Notes (Signed)
   PRENATAL VISIT NOTE  Subjective:  Erica Barber is a 24 y.o. G3P2002 at [redacted]w[redacted]d being seen today for ongoing prenatal care.  She is currently monitored for the following issues for this low-risk pregnancy and has Asthma; Anemia; Chlamydia infection affecting pregnancy; and Supervision of normal pregnancy, antepartum on their problem list.  Patient reports no complaints.  Contractions: Not present. Vag. Bleeding: None.  Movement: Present. Denies leaking of fluid.   The following portions of the patient's history were reviewed and updated as appropriate: allergies, current medications, past family history, past medical history, past social history, past surgical history and problem list.   Objective:   Vitals:   11/12/19 1405  BP: 127/79  Pulse: 85  Weight: 239 lb 14.4 oz (108.8 kg)    Fetal Status: Fetal Heart Rate (bpm): 145 Fundal Height: 39 cm Movement: Present  Presentation: Vertex  General:  Alert, oriented and cooperative. Patient is in no acute distress.  Skin: Skin is warm and dry. No rash noted.   Cardiovascular: Normal heart rate noted  Respiratory: Normal respiratory effort, no problems with respiration noted  Abdomen: Soft, gravid, appropriate for gestational age.  Pain/Pressure: Absent     Pelvic: Cervical exam performed in the presence of a chaperone Dilation: 1.5 Effacement (%): Thick Station: -3  Extremities: Normal range of motion.  Edema: None  Mental Status: Normal mood and affect. Normal behavior. Normal judgment and thought content.   Assessment and Plan:  Pregnancy: G3P2002 at [redacted]w[redacted]d 1. Supervision of other normal pregnancy, antepartum Patient is doing well without complaints Postdate testing next visit IOL at 41 weeks  Term labor symptoms and general obstetric precautions including but not limited to vaginal bleeding, contractions, leaking of fluid and fetal movement were reviewed in detail with the patient. Please refer to After Visit Summary for other  counseling recommendations.   Return in about 8 days (around 11/20/2019) for in person, ROB, Low risk, NST.  No future appointments.  Catalina Antigua, MD

## 2019-11-13 ENCOUNTER — Encounter (HOSPITAL_COMMUNITY): Payer: Self-pay | Admitting: *Deleted

## 2019-11-13 ENCOUNTER — Telehealth (HOSPITAL_COMMUNITY): Payer: Self-pay | Admitting: *Deleted

## 2019-11-13 NOTE — Telephone Encounter (Signed)
Preadmission screen  

## 2019-11-18 ENCOUNTER — Other Ambulatory Visit (HOSPITAL_COMMUNITY): Payer: Self-pay | Admitting: Advanced Practice Midwife

## 2019-11-20 ENCOUNTER — Ambulatory Visit (HOSPITAL_BASED_OUTPATIENT_CLINIC_OR_DEPARTMENT_OTHER): Payer: Medicaid Other | Admitting: Obstetrics

## 2019-11-20 ENCOUNTER — Other Ambulatory Visit: Payer: Self-pay | Admitting: Advanced Practice Midwife

## 2019-11-20 ENCOUNTER — Other Ambulatory Visit: Payer: Self-pay

## 2019-11-20 ENCOUNTER — Encounter: Payer: Self-pay | Admitting: Obstetrics

## 2019-11-20 VITALS — BP 127/81 | HR 85 | Wt 238.0 lb

## 2019-11-20 DIAGNOSIS — Z3483 Encounter for supervision of other normal pregnancy, third trimester: Secondary | ICD-10-CM

## 2019-11-20 DIAGNOSIS — Z3480 Encounter for supervision of other normal pregnancy, unspecified trimester: Secondary | ICD-10-CM

## 2019-11-20 DIAGNOSIS — Z3A40 40 weeks gestation of pregnancy: Secondary | ICD-10-CM | POA: Diagnosis not present

## 2019-11-20 NOTE — Progress Notes (Signed)
Subjective:  Erica Barber is a 24 y.o. G3P2002 at [redacted]w[redacted]d being seen today for ongoing prenatal care.  She is currently monitored for the following issues for this low-risk pregnancy and has Asthma; Anemia; Chlamydia infection affecting pregnancy; and Supervision of normal pregnancy, antepartum on their problem list.  Patient reports no complaints.  Contractions: Not present. Vag. Bleeding: None.  Movement: Present. Denies leaking of fluid.   The following portions of the patient's history were reviewed and updated as appropriate: allergies, current medications, past family history, past medical history, past social history, past surgical history and problem list. Problem list updated.  Objective:   Vitals:   11/20/19 1031  BP: 127/81  Pulse: 85  Weight: 238 lb (108 kg)    Fetal Status:     Movement: Present     General:  Alert, oriented and cooperative. Patient is in no acute distress.  Skin: Skin is warm and dry. No rash noted.   Cardiovascular: Normal heart rate noted  Respiratory: Normal respiratory effort, no problems with respiration noted  Abdomen: Soft, gravid, appropriate for gestational age. Pain/Pressure: Absent     Pelvic:  Cervical exam deferred        Extremities: Normal range of motion.     Mental Status: Normal mood and affect. Normal behavior. Normal judgment and thought content.   Urinalysis:      Assessment and Plan:  Pregnancy: G3P2002 at [redacted]w[redacted]d  1. Supervision of other normal pregnancy, antepartum Rx: - Fetal nonstress test:  Reactive.  Good variability.  FHR 140 -150's.  15x15 accels.  No decels.   Term labor symptoms and general obstetric precautions including but not limited to vaginal bleeding, contractions, leaking of fluid and fetal movement were reviewed in detail with the patient. Please refer to After Visit Summary for other counseling recommendations.   Return in about 4 weeks (around 12/18/2019) for postpartum visit.   Brock Bad, MD   11/20/2019

## 2019-11-22 ENCOUNTER — Other Ambulatory Visit (HOSPITAL_COMMUNITY)
Admission: RE | Admit: 2019-11-22 | Discharge: 2019-11-22 | Disposition: A | Discharge status: Home / Self Care | Payer: Medicaid Other | Source: Ambulatory Visit | Attending: Family Medicine | Admitting: Family Medicine

## 2019-11-22 DIAGNOSIS — Z20822 Contact with and (suspected) exposure to covid-19: Secondary | ICD-10-CM | POA: Diagnosis not present

## 2019-11-22 DIAGNOSIS — Z01812 Encounter for preprocedural laboratory examination: Secondary | ICD-10-CM | POA: Insufficient documentation

## 2019-11-22 LAB — SARS CORONAVIRUS 2 (TAT 6-24 HRS): SARS Coronavirus 2: NEGATIVE

## 2019-11-24 ENCOUNTER — Other Ambulatory Visit: Payer: Self-pay

## 2019-11-24 ENCOUNTER — Inpatient Hospital Stay (HOSPITAL_COMMUNITY): Payer: Medicaid Other | Admitting: Anesthesiology

## 2019-11-24 ENCOUNTER — Inpatient Hospital Stay (HOSPITAL_COMMUNITY)
Admission: AD | Admit: 2019-11-24 | Discharge: 2019-11-26 | DRG: 806 | Disposition: A | Discharge status: Home / Self Care | Payer: Medicaid Other | Attending: Family Medicine | Admitting: Family Medicine

## 2019-11-24 ENCOUNTER — Inpatient Hospital Stay (HOSPITAL_COMMUNITY): Payer: Medicaid Other

## 2019-11-24 ENCOUNTER — Encounter (HOSPITAL_COMMUNITY): Payer: Self-pay | Admitting: Family Medicine

## 2019-11-24 DIAGNOSIS — O99214 Obesity complicating childbirth: Secondary | ICD-10-CM | POA: Diagnosis present

## 2019-11-24 DIAGNOSIS — Z3A41 41 weeks gestation of pregnancy: Secondary | ICD-10-CM

## 2019-11-24 DIAGNOSIS — O9081 Anemia of the puerperium: Secondary | ICD-10-CM | POA: Diagnosis not present

## 2019-11-24 DIAGNOSIS — J45909 Unspecified asthma, uncomplicated: Secondary | ICD-10-CM | POA: Diagnosis present

## 2019-11-24 DIAGNOSIS — D649 Anemia, unspecified: Secondary | ICD-10-CM | POA: Diagnosis present

## 2019-11-24 DIAGNOSIS — A749 Chlamydial infection, unspecified: Secondary | ICD-10-CM | POA: Diagnosis present

## 2019-11-24 DIAGNOSIS — J45901 Unspecified asthma with (acute) exacerbation: Secondary | ICD-10-CM | POA: Diagnosis present

## 2019-11-24 DIAGNOSIS — Z3490 Encounter for supervision of normal pregnancy, unspecified, unspecified trimester: Secondary | ICD-10-CM | POA: Diagnosis present

## 2019-11-24 DIAGNOSIS — Z20822 Contact with and (suspected) exposure to covid-19: Secondary | ICD-10-CM | POA: Diagnosis present

## 2019-11-24 DIAGNOSIS — O9952 Diseases of the respiratory system complicating childbirth: Secondary | ICD-10-CM | POA: Diagnosis present

## 2019-11-24 DIAGNOSIS — D62 Acute posthemorrhagic anemia: Secondary | ICD-10-CM | POA: Diagnosis not present

## 2019-11-24 DIAGNOSIS — O480 Post-term pregnancy: Secondary | ICD-10-CM | POA: Diagnosis present

## 2019-11-24 DIAGNOSIS — Z3480 Encounter for supervision of other normal pregnancy, unspecified trimester: Secondary | ICD-10-CM

## 2019-11-24 DIAGNOSIS — O98819 Other maternal infectious and parasitic diseases complicating pregnancy, unspecified trimester: Secondary | ICD-10-CM

## 2019-11-24 LAB — CBC
HCT: 34.5 % — ABNORMAL LOW (ref 36.0–46.0)
Hemoglobin: 10.7 g/dL — ABNORMAL LOW (ref 12.0–15.0)
MCH: 27.3 pg (ref 26.0–34.0)
MCHC: 31 g/dL (ref 30.0–36.0)
MCV: 88 fL (ref 80.0–100.0)
Platelets: 150 10*3/uL (ref 150–400)
RBC: 3.92 MIL/uL (ref 3.87–5.11)
RDW: 15.1 % (ref 11.5–15.5)
WBC: 6.1 10*3/uL (ref 4.0–10.5)
nRBC: 0 % (ref 0.0–0.2)

## 2019-11-24 LAB — RPR: RPR Ser Ql: NONREACTIVE

## 2019-11-24 LAB — ABO/RH: ABO/RH(D): O POS

## 2019-11-24 LAB — TYPE AND SCREEN
ABO/RH(D): O POS
Antibody Screen: NEGATIVE

## 2019-11-24 MED ORDER — EPHEDRINE 5 MG/ML INJ: 10.0000 mg | INTRAVENOUS | Status: DC | PRN

## 2019-11-24 MED ORDER — TETANUS-DIPHTH-ACELL PERTUSSIS 5-2.5-18.5 LF-MCG/0.5 IM SUSP: 0.5000 mL | Freq: Once | INTRAMUSCULAR | Status: DC

## 2019-11-24 MED ORDER — SENNOSIDES-DOCUSATE SODIUM 8.6-50 MG PO TABS
2.0000 | ORAL_TABLET | ORAL | Status: DC
  Administered 2019-11-25: 23:00:00 2 via ORAL
  Filled 2019-11-24 (×2): qty 2

## 2019-11-24 MED ORDER — ALBUTEROL SULFATE (2.5 MG/3ML) 0.083% IN NEBU
3.0000 mL | INHALATION_SOLUTION | RESPIRATORY_TRACT | Status: DC | PRN
  Administered 2019-11-24: 19:00:00 3 mL via RESPIRATORY_TRACT
  Filled 2019-11-24: qty 3

## 2019-11-24 MED ORDER — OXYTOCIN 40 UNITS IN NORMAL SALINE INFUSION - SIMPLE MED
1.0000 m[IU]/min | INTRAVENOUS | Status: DC
  Administered 2019-11-24: 2 m[IU]/min via INTRAVENOUS

## 2019-11-24 MED ORDER — ZOLPIDEM TARTRATE 5 MG PO TABS: 5.0000 mg | ORAL_TABLET | Freq: Every evening | ORAL | Status: DC | PRN

## 2019-11-24 MED ORDER — LIDOCAINE HCL (PF) 1 % IJ SOLN
INTRAMUSCULAR | Status: DC | PRN
  Administered 2019-11-24: 5 mL via EPIDURAL

## 2019-11-24 MED ORDER — LACTATED RINGERS IV SOLN: 500.0000 mL | Freq: Once | INTRAVENOUS | Status: DC

## 2019-11-24 MED ORDER — ONDANSETRON HCL 4 MG/2ML IJ SOLN: 4.0000 mg | Freq: Four times a day (QID) | INTRAMUSCULAR | Status: DC | PRN

## 2019-11-24 MED ORDER — WITCH HAZEL-GLYCERIN EX PADS: 1.0000 "application " | MEDICATED_PAD | CUTANEOUS | Status: DC | PRN

## 2019-11-24 MED ORDER — ONDANSETRON HCL 4 MG/2ML IJ SOLN
4.0000 mg | INTRAMUSCULAR | Status: DC | PRN
  Administered 2019-11-24: 4 mg via INTRAVENOUS
  Filled 2019-11-24: qty 2

## 2019-11-24 MED ORDER — COCONUT OIL OIL: 1.0000 "application " | TOPICAL_OIL | Status: DC | PRN

## 2019-11-24 MED ORDER — OXYCODONE-ACETAMINOPHEN 5-325 MG PO TABS: 1.0000 | ORAL_TABLET | ORAL | Status: DC | PRN

## 2019-11-24 MED ORDER — LACTATED RINGERS IV SOLN: 500.0000 mL | INTRAVENOUS | Status: DC | PRN

## 2019-11-24 MED ORDER — BENZOCAINE-MENTHOL 20-0.5 % EX AERO: 1.0000 "application " | INHALATION_SPRAY | CUTANEOUS | Status: DC | PRN

## 2019-11-24 MED ORDER — PHENYLEPHRINE 40 MCG/ML (10ML) SYRINGE FOR IV PUSH (FOR BLOOD PRESSURE SUPPORT): 80.0000 ug | PREFILLED_SYRINGE | INTRAVENOUS | Status: DC | PRN

## 2019-11-24 MED ORDER — DIBUCAINE (PERIANAL) 1 % EX OINT: 1.0000 "application " | TOPICAL_OINTMENT | CUTANEOUS | Status: DC | PRN

## 2019-11-24 MED ORDER — OXYTOCIN BOLUS FROM INFUSION
500.0000 mL | Freq: Once | INTRAVENOUS | Status: AC
  Administered 2019-11-24: 500 mL via INTRAVENOUS

## 2019-11-24 MED ORDER — METHYLERGONOVINE MALEATE 0.2 MG/ML IJ SOLN: 0.2000 mg | Freq: Once | INTRAMUSCULAR | Status: AC

## 2019-11-24 MED ORDER — ACETAMINOPHEN 325 MG PO TABS: 650.0000 mg | ORAL_TABLET | ORAL | Status: DC | PRN

## 2019-11-24 MED ORDER — OXYCODONE HCL 5 MG PO TABS
5.0000 mg | ORAL_TABLET | ORAL | Status: DC | PRN
  Administered 2019-11-24: 5 mg via ORAL
  Filled 2019-11-24: qty 1

## 2019-11-24 MED ORDER — OXYCODONE-ACETAMINOPHEN 5-325 MG PO TABS: 2.0000 | ORAL_TABLET | ORAL | Status: DC | PRN

## 2019-11-24 MED ORDER — TRANEXAMIC ACID-NACL 1000-0.7 MG/100ML-% IV SOLN
INTRAVENOUS | Status: AC
  Administered 2019-11-24: 1000 mg via INTRAVENOUS
  Filled 2019-11-24: qty 100

## 2019-11-24 MED ORDER — OXYCODONE HCL 5 MG PO TABS: 10.0000 mg | ORAL_TABLET | ORAL | Status: DC | PRN

## 2019-11-24 MED ORDER — ONDANSETRON HCL 4 MG PO TABS: 4.0000 mg | ORAL_TABLET | ORAL | Status: DC | PRN

## 2019-11-24 MED ORDER — DIPHENHYDRAMINE HCL 50 MG/ML IJ SOLN: 12.5000 mg | INTRAMUSCULAR | Status: DC | PRN

## 2019-11-24 MED ORDER — LIDOCAINE HCL (PF) 1 % IJ SOLN: 30.0000 mL | INTRAMUSCULAR | Status: DC | PRN

## 2019-11-24 MED ORDER — TERBUTALINE SULFATE 1 MG/ML IJ SOLN: 0.2500 mg | Freq: Once | INTRAMUSCULAR | Status: DC | PRN

## 2019-11-24 MED ORDER — OXYTOCIN 40 UNITS IN NORMAL SALINE INFUSION - SIMPLE MED
2.5000 [IU]/h | INTRAVENOUS | Status: DC
  Filled 2019-11-24: qty 1000

## 2019-11-24 MED ORDER — IBUPROFEN 600 MG PO TABS
600.0000 mg | ORAL_TABLET | Freq: Four times a day (QID) | ORAL | Status: DC
  Administered 2019-11-25 – 2019-11-26 (×6): 600 mg via ORAL
  Filled 2019-11-24 (×7): qty 1

## 2019-11-24 MED ORDER — METHYLERGONOVINE MALEATE 0.2 MG/ML IJ SOLN
INTRAMUSCULAR | Status: AC
  Administered 2019-11-24: 22:00:00 0.2 mg via INTRAMUSCULAR
  Filled 2019-11-24: qty 1

## 2019-11-24 MED ORDER — LACTATED RINGERS IV SOLN: INTRAVENOUS | Status: DC

## 2019-11-24 MED ORDER — TRANEXAMIC ACID-NACL 1000-0.7 MG/100ML-% IV SOLN: 1000.0000 mg | INTRAVENOUS | Status: AC

## 2019-11-24 MED ORDER — FENTANYL-BUPIVACAINE-NACL 0.5-0.125-0.9 MG/250ML-% EP SOLN
12.0000 mL/h | EPIDURAL | Status: DC | PRN
  Filled 2019-11-24: qty 250

## 2019-11-24 MED ORDER — SIMETHICONE 80 MG PO CHEW: 80.0000 mg | CHEWABLE_TABLET | ORAL | Status: DC | PRN

## 2019-11-24 MED ORDER — DIPHENHYDRAMINE HCL 25 MG PO CAPS: 25.0000 mg | ORAL_CAPSULE | Freq: Four times a day (QID) | ORAL | Status: DC | PRN

## 2019-11-24 MED ORDER — PRENATAL MULTIVITAMIN CH
1.0000 | ORAL_TABLET | Freq: Every day | ORAL | Status: DC
  Administered 2019-11-25 – 2019-11-26 (×2): 1 via ORAL
  Filled 2019-11-24 (×2): qty 1

## 2019-11-24 MED ORDER — SOD CITRATE-CITRIC ACID 500-334 MG/5ML PO SOLN: 30.0000 mL | ORAL | Status: DC | PRN

## 2019-11-24 MED ORDER — ACETAMINOPHEN 325 MG PO TABS
650.0000 mg | ORAL_TABLET | ORAL | Status: DC | PRN
  Administered 2019-11-24: 650 mg via ORAL
  Filled 2019-11-24: qty 2

## 2019-11-24 MED ORDER — SODIUM CHLORIDE (PF) 0.9 % IJ SOLN
INTRAMUSCULAR | Status: DC | PRN
  Administered 2019-11-24: 12 mL/h via EPIDURAL

## 2019-11-24 NOTE — Anesthesia Preprocedure Evaluation (Signed)
Anesthesia Evaluation  Patient identified by MRN, date of birth, ID band Patient awake    Reviewed: Allergy & Precautions, H&P , NPO status , Patient's Chart, lab work & pertinent test results  Airway Mallampati: II   Neck ROM: full    Dental   Pulmonary asthma ,    breath sounds clear to auscultation       Cardiovascular negative cardio ROS   Rhythm:regular Rate:Normal     Neuro/Psych    GI/Hepatic   Endo/Other  Morbid obesity  Renal/GU      Musculoskeletal   Abdominal   Peds  Hematology  (+) Blood dyscrasia, anemia ,   Anesthesia Other Findings   Reproductive/Obstetrics (+) Pregnancy                             Anesthesia Physical Anesthesia Plan  ASA: II  Anesthesia Plan: Epidural   Post-op Pain Management:    Induction: Intravenous  PONV Risk Score and Plan: 2 and Treatment may vary due to age or medical condition  Airway Management Planned: Natural Airway  Additional Equipment:   Intra-op Plan:   Post-operative Plan:   Informed Consent: I have reviewed the patients History and Physical, chart, labs and discussed the procedure including the risks, benefits and alternatives for the proposed anesthesia with the patient or authorized representative who has indicated his/her understanding and acceptance.       Plan Discussed with: Anesthesiologist  Anesthesia Plan Comments:         Anesthesia Quick Evaluation

## 2019-11-24 NOTE — Discharge Instructions (Signed)

## 2019-11-24 NOTE — Discharge Summary (Signed)
Postpartum Discharge Summary     Patient Name: Erica Barber DOB: 1996-07-02 MRN: [id number]  Date of admission: 11/24/2019 Delivering Provider: Merilyn Baba   Date of discharge: 11/26/2019  Admitting diagnosis: Encounter for induction of labor [Z34.90] Intrauterine pregnancy: [redacted]w[redacted]d    Secondary diagnosis:  Principal Problem:   Encounter for induction of labor Active Problems:   Asthma   Anemia   Chlamydia infection affecting pregnancy   PPH (postpartum hemorrhage)  Additional problems: None     Discharge diagnosis: Term Pregnancy Del[place] and [place]                                                                                               Post partum procedures: None  Augmentation: AROM and Pitocin  Complications: asthma exacerbation during delivery  Hospital course:  Induction of Labor With Vaginal Delivery   24y.o. yo G3P2002 at 465w0das admitted to the hospital 11/24/2019 for induction of labor.  Indication for induction: Postdates.  Patient had an uncomplicated labor course as follows:  Admitted for PD IOL at 41 week. Started on Pitocin. Eventually AROMed. She progressed to complete and delivered shortly thereafter.  Membrane Rupture Time/Date: 3:11 PM ,11/24/2019   Intrapartum Procedures: Episiotomy: None [1]                                         Lacerations:  Periurethral [8]  Patient had delivery of a Viable infant.  Information for the patient's newborn:  AdLashawnda, Hancox0[[id number]]Delivery Method: Vaginal, Spontaneous(Filed from Delivery Summary)    11/24/2019  Details of delivery can be found in separate delivery note. Declined BTL. PO iron continued on discharge. Patient had a routine postpartum course. Patient is discharged home 11/26/19. Delivery time: 6:39 PM    Magnesium Sulfate received: No BMZ received: No Rhophylac:N/A MMR:N/A Transfusion:No  Physical exam  Vitals:   11/25/19 1208 11/25/19 1925 11/25/19 2333 11/26/19 0524   BP: 115/63 122/65 115/68 110/67  Pulse: 83 79 77 87  Resp: '18 17 18 15  '$ Temp: 98 F (36.7 C) 97.9 F (36.6 C) 98.2 F (36.8 C) 97.8 F (36.6 C)  TempSrc: Oral Oral Oral Oral  SpO2:  100%  100%  Weight:      Height:       General: alert, cooperative and no distress Lochia: appropriate Uterine Fundus: firm Incision: NA DVT Evaluation: No evidence of DVT seen on physical exam. Labs: Lab Results  Component Value Date   WBC 11.6 (H) 11/25/2019   HGB 9.9 (L) 11/25/2019   HCT 31.0 (L) 11/25/2019   MCV 89.1 11/25/2019   PLT 138 (L) 11/25/2019   CMP Latest Ref Rng & Units 06/27/2019  Glucose 70 - 99 mg/dL 79  BUN 6 - 20 mg/dL <5(L)  Creatinine 0.44 - 1.00 mg/dL 0.60  Sodium 135 - 145 mmol/L 137  Potassium 3.5 - 5.1 mmol/L 3.5  Chloride 98 - 111 mmol/L 104  CO2 22 - 32 mmol/L 24  Calcium 8.9 - 10.3 mg/dL 8.8(L)  Total Protein 6.5 - 8.1 g/dL 6.2(L)  Total Bilirubin 0.3 - 1.2 mg/dL 0.6  Alkaline Phos 38 - 126 U/L 36(L)  AST 15 - 41 U/L 12(L)  ALT 0 - 44 U/L 7   Edinburgh Score: Edinburgh Postnatal Depression Scale Screening Tool 11/25/2019  I have been able to laugh and see the funny side of things. 0  I have looked forward with enjoyment to things. 0  I have blamed myself unnecessarily when things went wrong. 0  I have been anxious or worried for no good reason. 2  I have felt scared or panicky for no good reason. 0  Things have been getting on top of me. 1  I have been so unhappy that I have had difficulty sleeping. 0  I have felt sad or miserable. 2  I have been so unhappy that I have been crying. 0  The thought of harming myself has occurred to me. 0  Edinburgh Postnatal Depression Scale Total 5    Discharge instruction: per After Visit Summary and "Baby and Me Booklet".  After visit meds:  Allergies as of 11/26/2019   No Known Allergies     Medication List    STOP taking these medications   Abdominal Binder/Elastic Large Misc    butalbital-acetaminophen-caffeine 50-325-40 MG tablet Commonly known as: FIORICET   metoCLOPramide 10 MG tablet Commonly known as: REGLAN   Prenatal Adult Gummy/DHA/FA 0.4-25 MG Chew     TAKE these medications   albuterol 108 (90 Base) MCG/ACT inhaler Commonly known as: VENTOLIN HFA Inhale 2 puffs into the lungs every 6 (six) hours as needed for wheezing or shortness of breath.   Blood Pressure Monitor Kit 1 Device by Does not apply route once a week. To be monitored Regularly at home.   ferrous sulfate 325 (65 FE) MG tablet Take 1 tablet (325 mg total) by mouth daily with breakfast.   ibuprofen 600 MG tablet Commonly known as: ADVIL Take 1 tablet (600 mg total) by mouth every 6 (six) hours.   Prenatal Gummies/DHA & FA 0.4-32.5 MG Chew Chew 1 each by mouth at bedtime.   senna-docusate 8.6-50 MG tablet Commonly known as: Senokot-S Take 2 tablets by mouth daily. Start taking on: Nov 27, 2019       Diet: routine diet  Activity: Advance as tolerated. Pelvic rest for 6 weeks.   Outpatient follow up:4 weeks Follow up Appt: Future Appointments  Date Time Provider [place]  12/26/2019 10:15 AM Gavin Pound, CNM [facility] None   Follow up Visit:    Please schedule this patient for Postpartum visit in: 4 weeks with the following provider: Any provider In-person For C/S patients schedule nurse incision check in weeks 2 weeks: no Low risk pregnancy complicated by: post dates, asthma, h/o shoulder dystocia Delivery mode:  SVD Anticipated Birth Control: IUD PP Procedures needed: IUD placement Schedule Integrated BH visit: no     Newborn Data: Live born female  Birth Weight:  3561g APGAR: 64, 9  Newborn Delivery   Birth date/time: 11/24/2019 18:39:00 Delivery type: Vaginal, Spontaneous      Baby Feeding: Bottle and Breast Disposition:home with mother   11/26/2019 Chauncey Mann, MD

## 2019-11-24 NOTE — Anesthesia Procedure Notes (Addendum)
Epidural Patient location during procedure: OB Start time: 11/24/2019 12:02 PM End time: 11/24/2019 12:10 PM  Staffing Anesthesiologist: Achille Rich, MD Performed: anesthesiologist   Preanesthetic Checklist Completed: patient identified, IV checked, site marked, risks and benefits discussed, monitors and equipment checked, pre-op evaluation and timeout performed  Epidural Patient position: sitting Prep: DuraPrep Patient monitoring: heart rate, cardiac monitor, continuous pulse ox and blood pressure Approach: midline Location: L2-L3 Injection technique: LOR air  Needle:  Needle type: Tuohy  Needle gauge: 17 G Needle length: 9 cm Needle insertion depth: 9 cm Catheter type: closed end flexible Catheter size: 19 Gauge Catheter at skin depth: 15 cm Test dose: negative and Other  Assessment Events: blood not aspirated, injection not painful, no injection resistance and negative IV test  Additional Notes Informed consent obtained prior to proceeding including risk of failure, 1% risk of PDPH, risk of minor discomfort and bruising.  Discussed rare but serious complications including epidural abscess, permanent nerve injury, epidural hematoma.  Discussed alternatives to epidural analgesia and patient desires to proceed.  Timeout performed pre-procedure verifying patient name, procedure, and platelet count.  Patient tolerated procedure well.  4 pass attempts before finding LOR.  9cm depth to LOR. Pt had difficulty maintaining proper positioning and remaining still. Reason for block:procedure for pain

## 2019-11-24 NOTE — Progress Notes (Signed)
Erica Barber is a 24 y.o. G3P2002 at [redacted]w[redacted]d admitted for IOL 2/2 post dates  Subjective: Comfortable with epidural  Objective: BP 130/71   Pulse 85   Ht 5\' 3"  (1.6 m)   Wt 108 kg   LMP 04/10/2019   SpO2 100%   BMI 42.16 kg/m  Total I/O In: 1013.9 [I.V.:1013.9] Out: -   FHT:  FHR: 135 bpm, variability: moderate,  accelerations:  Present,  decelerations:  Absent UC:   regular, every 3-4 minutes  SVE:   Dilation: 4 Effacement (%): 60 Station: -1 Exam by:: J.Cox, RN  Pitocin @ 14 mu/min  Labs: Lab Results  Component Value Date   WBC 6.1 11/24/2019   HGB 10.7 (L) 11/24/2019   HCT 34.5 (L) 11/24/2019   MCV 88.0 11/24/2019   PLT 150 11/24/2019    Assessment / Plan: Erica Barber is a 24 y.o. G3P2002 at [redacted]w[redacted]d here for IOL 2/2 post dates  1. Labor: On pitocin, AROM this check with meconium-stained fluid 2. FWB: Cat I 3. Pain: epidural 4. GBS: negative 5. History of <30 second shoulder dystocia- shoulder precautions already discussed  [redacted]w[redacted]d DO OB Fellow, [facility] 11/24/2019, 3:13 PM

## 2019-11-24 NOTE — Lactation Note (Signed)
This note was copied from a baby's chart. Lactation Consultation Note Mom had code Hemorrhage. Mom not feeling well. Baby was taken to the CN. Left brochure and told mom Lactation would see her tomorrow unless she calls for Lactation assistance tonight. Mom is Breast/formula. Mom stated she Bf/formula her other 2 children. Mom stated the other children would only latch to her Lt. Breast and the Rt. Breast didn't put out much milk.  Mom stated she was going to formula feed because she wasn't feeling good. She told FOB she couldn't BF d/t the clots and bleeding. LC mentioned that BF would help w/bleeding. It helps contract uterus and helps w/bleeding. Mom stated when baby comes back to room she may call for BF assistance.  Lactation brochure given.  Patient Name: Erica Barber RCVEL'F Date: 11/24/2019     Maternal Data    Feeding Feeding Type: Bottle Fed - Formula Nipple Type: Nfant Extra Slow Flow (gold)  LATCH Score                   Interventions    Lactation Tools Discussed/Used     Consult Status      Tyheem Boughner G 11/24/2019, 11:13 PM

## 2019-11-24 NOTE — Progress Notes (Signed)
Pt c/o of SOB and chest tightness also slightly wheezing pulse ox applied sats normal . [place] N req pts inhaler. Pt only eligible for nebulizer treatment resp n will come give.

## 2019-11-24 NOTE — Progress Notes (Signed)
Called to room to evaluate bleeding. EBL ~ 800 mL including delivery and postpartum bleeding now. Fundus at +1 and mildly firm but trickle noted with massage as well as expulsion of a few clots. Will give TXA and Methergine. Patient did not receive any medications with delivery but Dr. Salomon Mast performed uterine sweep so this was not done at this time; will repeat if bleeding persists.  AM CBC ordered. Vitals stable and patient currently asymptomatic.   Jerilynn Birkenhead, MD [facility] Family Medicine Fellow, [facility] for [employer], [facility]

## 2019-11-24 NOTE — H&P (Signed)
OBSTETRIC ADMISSION HISTORY AND PHYSICAL  Erica Barber is a 23 y.o. female G80P2002 with IUP at 55w0dpresenting for post dates. She reports +FMs. No LOF, VB, blurry vision, headaches, peripheral edema, or RUQ pain. She plans on breast and bottle feeding. She requests BTL for birth control.  Dating: By 135week [place]--->  Estimated Date of Delivery: 11/17/19  Sono:   '@[redacted]w[redacted]d'$ , incomplete anatomy, cephalic presentation, 24163A 37%ile, EFW 5#12 -cavum, ductal arch, not well seen -normal growth and amniotic fluid  Prenatal History/Complications: H/o <<45second shoulder dystocia Anemia  Past Medical History: Past Medical History:  Diagnosis Date  . Asthma     Past Surgical History: Past Surgical History:  Procedure Laterality Date  . NO PAST SURGERIES      Obstetrical History: OB History    Gravida  3   Para  2   Term  2   Preterm      AB      Living  2     SAB      TAB      Ectopic      Multiple  0   Live Births  2           Social History: Social History   Socioeconomic History  . Marital status: Single    Spouse name: Not on file  . Number of children: 1  . Years of education: Not on file  . Highest education level: High school graduate  Occupational History  . Not on file  Tobacco Use  . Smoking status: Never Smoker  . Smokeless tobacco: Never Used  Substance and Sexual Activity  . Alcohol use: No  . Drug use: Not Currently    Types: Marijuana    Comment: not since she has been pregnant  . Sexual activity: Yes    Partners: Male    Comment: Pregnant   Other Topics Concern  . Not on file  Social History Narrative  . Not on file   Social Determinants of Health   Financial Resource Strain:   . Difficulty of Paying Living Expenses:   Food Insecurity:   . Worried About RCharity fundraiserin the Last Year:   . RArboriculturistin the Last Year:   Transportation Needs:   . LFilm/video editor(Medical):   .Marland KitchenLack of Transportation  (Non-Medical):   Physical Activity:   . Days of Exercise per Week:   . Minutes of Exercise per Session:   Stress:   . Feeling of Stress :   Social Connections:   . Frequency of Communication with Friends and Family:   . Frequency of Social Gatherings with Friends and Family:   . Attends Religious Services:   . Active Member of Clubs or Organizations:   . Attends CArchivistMeetings:   .Marland KitchenMarital Status:     Family History: Family History  Problem Relation Age of Onset  . Hypertension Father   . Hypertension Paternal Grandmother     Allergies: No Known Allergies  Medications Prior to Admission  Medication Sig Dispense Refill Last Dose  . Blood Pressure Monitor KIT 1 Device by Does not apply route once a week. To be monitored Regularly at home. 1 kit 0   . butalbital-acetaminophen-caffeine (FIORICET) 50-325-40 MG tablet Take 1-2 tablets by mouth every 6 (six) hours as needed for headache. (Patient not taking: Reported on 10/30/2019) 30 tablet 2   . Elastic Bandages & Supports (ABDOMINAL BINDER/ELASTIC LARGE)  MISC 1 each by Does not apply route as needed. 1 each 0   . metoCLOPramide (REGLAN) 10 MG tablet Take 1 tablet (10 mg total) by mouth every 8 (eight) hours as needed. (Patient not taking: Reported on 10/30/2019) 90 tablet 0   . Prenatal MV & Min w/FA-DHA (PRENATAL ADULT GUMMY/DHA/FA) 0.4-25 MG CHEW Chew 1 tablet by mouth daily. (Patient not taking: Reported on 10/30/2019) 30 tablet 4   . Prenatal MV-Min-FA-Omega-3 (PRENATAL GUMMIES/DHA & FA) 0.4-32.5 MG CHEW Chew 1 each by mouth at bedtime. 90 tablet 3      Review of Systems:  All systems reviewed and negative except as stated in HPI  PE: Blood pressure 129/63, pulse 74, height '5\' 3"'$  (1.6 m), weight 108 kg, last menstrual period 04/10/2019, unknown if currently breastfeeding. General appearance: alert, cooperative and appears stated age Lungs: regular rate and effort Heart: regular rate  Abdomen: soft,  non-tender Extremities: Homans sign is negative, no sign of DVT Presentation: cephalic EFM: 973 bpm, moderate variability, 15x15 accels, no decels Toco: no contractions Dilation: 3.5 Effacement (%): 60 Station: -1 Exam by:: J.Cox, RN  Prenatal labs: ABO, Rh: O/Positive/-- (12/28 1357) Antibody: Negative (12/28 1357) Rubella: 8.27 (12/28 1357) RPR: Non Reactive (01/25 1010)  HBsAg: Negative (12/28 1357)  HIV: Non Reactive (01/25 1010)  GBS: Negative/-- (04/14 0410)  2 hr GTT normal  Prenatal Transfer Tool  Maternal Diabetes: No Genetic Screening: Normal Maternal Ultrasounds/Referrals: Other: incomplete anatomy Fetal Ultrasounds or other Referrals:  Referred to Materal Fetal Medicine  Maternal Substance Abuse:  No Significant Maternal Medications:  None Significant Maternal Lab Results: Group B Strep negative  Results for orders placed or performed during the hospital encounter of 11/24/19 (from the past 24 hour(s))  CBC   Collection Time: 11/24/19  7:58 AM  Result Value Ref Range   WBC 6.1 4.0 - 10.5 K/uL   RBC 3.92 3.87 - 5.11 MIL/uL   Hemoglobin 10.7 (L) 12.0 - 15.0 g/dL   HCT 34.5 (L) 36.0 - 46.0 %   MCV 88.0 80.0 - 100.0 fL   MCH 27.3 26.0 - 34.0 pg   MCHC 31.0 30.0 - 36.0 g/dL   RDW 15.1 11.5 - 15.5 %   Platelets 150 150 - 400 K/uL   nRBC 0.0 0.0 - 0.2 %    Patient Active Problem List   Diagnosis Date Noted  . Encounter for induction of labor 11/24/2019  . Supervision of normal pregnancy, antepartum 07/15/2019  . Chlamydia infection affecting pregnancy 07/03/2019  . Anemia 04/11/2016  . Asthma 10/16/2015    Assessment: Erica Barber is a 24 y.o. G3P2002 at 66w0dhere for IOL 2/2 post dates  1. Labor: Favorable cervix. Start pitocin. Consider AROM once contractions are regular. 2. FWB: Cat I 3. Pain: per patient request 4. GBS: negative 5. History of <30 second shoulder dystocia   Plan: Admit to L&D  Risks and benefits of induction were  reviewed, including failure of method, prolonged labor, need for further intervention, risk of cesarean.  Patient and family seem to understand these risks and wish to proceed. Options of cytotec, foley bulb, AROM, and pitocin reviewed, with use of each discussed.  Shoulder precautions discussed in detail, including but not limited to: need for additional procedures, additional providers in the room, attendance of NICU staff, and potential need for emergent Cesarean delivery.  HOrchid DO  11/24/2019, 8:51 AM

## 2019-11-25 ENCOUNTER — Inpatient Hospital Stay (HOSPITAL_COMMUNITY): Payer: Medicaid Other | Admitting: Anesthesiology

## 2019-11-25 ENCOUNTER — Encounter (HOSPITAL_COMMUNITY)
Admission: AD | Disposition: A | Discharge status: Home / Self Care | Payer: Self-pay | Source: Home / Self Care | Attending: Family Medicine

## 2019-11-25 LAB — CBC
HCT: 31 % — ABNORMAL LOW (ref 36.0–46.0)
Hemoglobin: 9.9 g/dL — ABNORMAL LOW (ref 12.0–15.0)
MCH: 28.4 pg (ref 26.0–34.0)
MCHC: 31.9 g/dL (ref 30.0–36.0)
MCV: 89.1 fL (ref 80.0–100.0)
Platelets: 138 K/uL — ABNORMAL LOW (ref 150–400)
RBC: 3.48 MIL/uL — ABNORMAL LOW (ref 3.87–5.11)
RDW: 15.2 % (ref 11.5–15.5)
WBC: 11.6 K/uL — ABNORMAL HIGH (ref 4.0–10.5)
nRBC: 0 % (ref 0.0–0.2)

## 2019-11-25 SURGERY — LIGATION, FALLOPIAN TUBE, POSTPARTUM
Anesthesia: Epidural

## 2019-11-25 SURGERY — Surgical Case
Anesthesia: Regional

## 2019-11-25 MED ORDER — LIDOCAINE-EPINEPHRINE (PF) 2 %-1:200000 IJ SOLN
INTRAMUSCULAR | Status: AC
  Filled 2019-11-25: qty 10

## 2019-11-25 MED ORDER — METOCLOPRAMIDE HCL 10 MG PO TABS
10.0000 mg | ORAL_TABLET | Freq: Once | ORAL | Status: AC
  Administered 2019-11-25: 10 mg via ORAL
  Filled 2019-11-25: qty 1

## 2019-11-25 MED ORDER — FAMOTIDINE 20 MG PO TABS
40.0000 mg | ORAL_TABLET | Freq: Once | ORAL | Status: AC
  Administered 2019-11-25: 40 mg via ORAL
  Filled 2019-11-25: qty 2

## 2019-11-25 MED ORDER — SODIUM BICARBONATE 8.4 % IV SOLN
INTRAVENOUS | Status: AC
  Filled 2019-11-25: qty 50

## 2019-11-25 MED ORDER — FENTANYL CITRATE (PF) 100 MCG/2ML IJ SOLN
INTRAMUSCULAR | Status: AC
  Filled 2019-11-25: qty 2

## 2019-11-25 MED ORDER — FERROUS SULFATE 325 (65 FE) MG PO TABS
325.0000 mg | ORAL_TABLET | Freq: Two times a day (BID) | ORAL | Status: DC
  Administered 2019-11-25 – 2019-11-26 (×3): 325 mg via ORAL
  Filled 2019-11-25 (×3): qty 1

## 2019-11-25 MED ORDER — ONDANSETRON HCL 4 MG/2ML IJ SOLN
INTRAMUSCULAR | Status: AC
  Filled 2019-11-25: qty 2

## 2019-11-25 MED ORDER — MIDAZOLAM HCL 2 MG/2ML IJ SOLN
INTRAMUSCULAR | Status: AC
  Filled 2019-11-25: qty 2

## 2019-11-25 MED ORDER — LACTATED RINGERS IV SOLN: INTRAVENOUS | Status: DC

## 2019-11-25 NOTE — Anesthesia Postprocedure Evaluation (Signed)
Anesthesia Post Note  Patient: Erica Barber  Procedure(s) Performed: AN AD HOC LABOR EPIDURAL     Patient location during evaluation: Mother Baby Anesthesia Type: Epidural Level of consciousness: awake and alert Pain management: pain level controlled Vital Signs Assessment: post-procedure vital signs reviewed and stable Respiratory status: spontaneous breathing, nonlabored ventilation and respiratory function stable Cardiovascular status: stable Postop Assessment: no headache, no backache and epidural receding Anesthetic complications: no    Last Vitals:  Vitals:   11/25/19 0453 11/25/19 0720  BP: 113/76 (!) 103/59  Pulse: 90 83  Resp: 16 19  Temp: 37.1 C 36.8 C  SpO2: 100% 100%    Last Pain:  Vitals:   11/25/19 0720  TempSrc: Oral  PainSc: 2    Pain Goal:                   Soila Printup

## 2019-11-25 NOTE — Anesthesia Preprocedure Evaluation (Addendum)
Anesthesia Evaluation  Patient identified by MRN, date of birth, ID band Patient awake    Reviewed: Allergy & Precautions, NPO status , Patient's Chart, lab work & pertinent test results  History of Anesthesia Complications Negative for: history of anesthetic complications  Airway Mallampati: II  TM Distance: >3 FB Neck ROM: Full    Dental  (+) Dental Advisory Given   Pulmonary asthma ,    Pulmonary exam normal        Cardiovascular negative cardio ROS Normal cardiovascular exam     Neuro/Psych negative neurological ROS  negative psych ROS   GI/Hepatic negative GI ROS, Neg liver ROS,   Endo/Other  Morbid obesity  Renal/GU negative Renal ROS     Musculoskeletal negative musculoskeletal ROS (+)   Abdominal (+) + obese,   Peds  Hematology  (+) anemia ,  Plt 138k    Anesthesia Other Findings Covid neg 5/7 Chlamydia   Reproductive/Obstetrics  S/p vaginal delivery 11/24/19                             Anesthesia Physical Anesthesia Plan  ASA: III  Anesthesia Plan: Epidural   Post-op Pain Management:    Induction:   PONV Risk Score and Plan: 2 and Treatment may vary due to age or medical condition and Ondansetron  Airway Management Planned: Natural Airway  Additional Equipment: None  Intra-op Plan:   Post-operative Plan:   Informed Consent: I have reviewed the patients History and Physical, chart, labs and discussed the procedure including the risks, benefits and alternatives for the proposed anesthesia with the patient or authorized representative who has indicated his/her understanding and acceptance.       Plan Discussed with: Anesthesiologist  Anesthesia Plan Comments: (Plan to use previously placed epidural for procedure)      Anesthesia Quick Evaluation

## 2019-11-25 NOTE — Progress Notes (Signed)
At approximately 2140, RN assisted pt to bathroom utilizing stedy. Upon entering restroom, RN noted heavy blood flow dripping down pts leg, followed by multiple baseball/softball clots that fell out of pt onto pad and onto floor.   RN contacted Dr. Morene Antu, and upon arrival Dr. Morene Antu gave verbal orders for TXA and Methergine. Before med admin pt was firm, midline, and 1/U.   Medications were given, and pt passed several more large baseball/softball sized clots, followed by heavy gushes of fluid during fundal assessments.With each fundal assessment, RN assessed improvement in bleeding and fundal tone. Fundus transitioned from 1/U to U/E.   Pt returned to baseline, VS stable, small bleeding, no noticeable clots.  Total QBL from this encounter is 1679.   RN performed Q15 x2 and Q1 x 2 fundal/vs assessments. A significant physical improvement was noted from RN and per patient. RN assisted pt to restroom, and administered 5mg  of Oxy IR.   Pt states that she is feeling better and denies questions.   Will continue to monitor.

## 2019-11-25 NOTE — Progress Notes (Addendum)
Post Partum Day 1 Subjective:  Patient doing well.  Reports voiding and having flatus.  Denies headache, lightheadedness, blurry vision or seeing spots. Pt ambulatory in the room and is tolerating po. Pain is well controlled. States her vaginal bleeding has slowed down a lot.    Objective: Blood pressure 113/76, pulse 90, temperature 98.8 F (37.1 C), temperature source Oral, resp. rate 16, height 5\' 3"  (1.6 m), weight 108 kg, last menstrual period 04/10/2019, SpO2 100 %, unknown if currently breastfeeding.  Physical Exam:  General: alert and no distress Lochia: appropriate Uterine Fundus: firm Incision: NA DVT Evaluation: No evidence of DVT seen on physical exam.   Recent Labs    11/24/19 0758 11/25/19 0607  HGB 10.7* 9.9*  HCT 34.5* 31.0*    Assessment/Plan: Plan for discharge tomorrow. Breast and bottle feeding. VSS.  Patient with EBL ~1372mL. Hemoglobin stable at 9.9 from 10.7 on admission. Started BID iron supplementation. Planning for BTL later this morning.    LOS: 1 day   80m, DO  11/25/2019, 7:43 AM   I saw and evaluated the patient. I agree with the findings and the plan of care as documented in the resident's note.  01/25/2020, MD [facility] Family Medicine Fellow, [facility] for [facility]

## 2019-11-25 NOTE — Lactation Note (Signed)
This note was copied from a baby's chart. Lactation Consultation Note Baby 12 hrs old. Mom is breast/formula. Mom's 3rd child. Mom BF her almost 24 yr old for 1 yr, her 24 yr old for 4 months. Mom stated no difficulties. Mom had PPH. Discussed w/mom the need for pumping d/t PPH can cause milk supply to decrease. Mom didn't act to excited about pumping. Encouraged mom to hydrate.  Mom is having BTL this am. Mom stated a lot going on right now. Mom states she is very tired and drained.  Newborn behavior, STS, I&O, supply and demand discussed. Mom latched in football position. LC watched latch, just assisting in body position. Baby latched well. Mentioned to mom if she needs lactation to call. Lactation can check on mom later. Lactation brochure given.  Patient Name: Erica Barber IBBCW'U Date: 11/25/2019 Reason for consult: Initial assessment;Term   Maternal Data Has patient been taught Hand Expression?: Yes Does the patient have breastfeeding experience prior to this delivery?: Yes  Feeding Feeding Type: Breast Fed Nipple Type: Slow - flow  LATCH Score Latch: Grasps breast easily, tongue down, lips flanged, rhythmical sucking.  Audible Swallowing: None  Type of Nipple: Everted at rest and after stimulation(short shaft)  Comfort (Breast/Nipple): Soft / non-tender  Hold (Positioning): Assistance needed to correctly position infant at breast and maintain latch.  LATCH Score: 7  Interventions Interventions: Breast feeding basics reviewed;Support pillows;Assisted with latch;Position options;Skin to skin;Breast massage;Hand express;Breast compression;Adjust position  Lactation Tools Discussed/Used WIC Program: Yes   Consult Status Consult Status: Follow-up Date: 11/25/19 Follow-up type: In-patient    Charyl Dancer 11/25/2019, 6:49 AM

## 2019-11-26 MED ORDER — FERROUS SULFATE 325 (65 FE) MG PO TABS: 325.0000 mg | ORAL_TABLET | Freq: Every day | ORAL | 0 refills | Status: DC

## 2019-11-26 MED ORDER — IBUPROFEN 600 MG PO TABS: 600.0000 mg | ORAL_TABLET | Freq: Four times a day (QID) | ORAL | 0 refills | Status: DC

## 2019-11-26 MED ORDER — SENNOSIDES-DOCUSATE SODIUM 8.6-50 MG PO TABS: 2.0000 | ORAL_TABLET | ORAL | 0 refills | Status: DC

## 2019-11-26 MED ORDER — ALBUTEROL SULFATE HFA 108 (90 BASE) MCG/ACT IN AERS
2.0000 | INHALATION_SPRAY | Freq: Four times a day (QID) | RESPIRATORY_TRACT | 1 refills | Status: AC | PRN
Start: 2019-11-26 — End: ?

## 2019-11-26 NOTE — Lactation Note (Signed)
This note was copied from a baby's chart. Lactation Consultation Note  Patient Name: Boy Tomie Spizzirri PYPPJ'[id number] Date: 11/26/2019 Reason for consult: Follow-up assessment   Mother is a P3, infant is 56 hours old and is now at 5% wt loss.   Mother reports that infant is feeding well. Mother is supplementing as well.  Reviewed hand expression with mother. Observed large drops of colostrum. Mother ask for a   harmony hand pump .  Mothers nipples are erect with compressible breast tissue. No observed trama of mothers nipples. Mother was given a #27 flange.   She is active with WIC . She plans to get formula instead of a pump.  Discussed treatment and prevention of engorgement.   Mother to continue to cue base feed infant and feed at least 8-12 times or more in 24 hours and advised to allow for cluster feeding infant as needed.   Mother to continue to due STS. Mother is aware of available LC services at [facility], BFSG'S, OP Dept, and phone # for questions or concerns about breastfeeding.  Mother receptive to all teaching and plan of care.     Maternal Data    Feeding Feeding Type: Breast Fed  LATCH Score Latch: Grasps breast easily, tongue down, lips flanged, rhythmical sucking.  Audible Swallowing: Spontaneous and intermittent  Type of Nipple: Everted at rest and after stimulation  Comfort (Breast/Nipple): Soft / non-tender  Hold (Positioning): Assistance needed to correctly position infant at breast and maintain latch.  LATCH Score: 9  Interventions Interventions: Hand pump  Lactation Tools Discussed/Used     Consult Status Consult Status: Complete    Michel Bickers 11/26/2019, 11:27 AM

## 2019-11-27 NOTE — Progress Notes (Signed)
Work note sent to [employer]

## 2019-12-01 ENCOUNTER — Encounter (HOSPITAL_COMMUNITY): Payer: Self-pay | Admitting: Obstetrics and Gynecology

## 2019-12-01 ENCOUNTER — Inpatient Hospital Stay (HOSPITAL_COMMUNITY)
Admission: AD | Admit: 2019-12-01 | Discharge: 2019-12-02 | Disposition: A | Discharge status: Home / Self Care | Payer: Medicaid Other | Attending: Obstetrics and Gynecology | Admitting: Obstetrics and Gynecology

## 2019-12-01 DIAGNOSIS — N939 Abnormal uterine and vaginal bleeding, unspecified: Secondary | ICD-10-CM | POA: Insufficient documentation

## 2019-12-01 DIAGNOSIS — O9081 Anemia of the puerperium: Secondary | ICD-10-CM | POA: Diagnosis not present

## 2019-12-01 DIAGNOSIS — M549 Dorsalgia, unspecified: Secondary | ICD-10-CM | POA: Insufficient documentation

## 2019-12-01 DIAGNOSIS — Z79899 Other long term (current) drug therapy: Secondary | ICD-10-CM | POA: Diagnosis not present

## 2019-12-01 DIAGNOSIS — O9902 Anemia complicating childbirth: Secondary | ICD-10-CM

## 2019-12-01 DIAGNOSIS — J45909 Unspecified asthma, uncomplicated: Secondary | ICD-10-CM | POA: Insufficient documentation

## 2019-12-01 DIAGNOSIS — O99893 Other specified diseases and conditions complicating puerperium: Secondary | ICD-10-CM | POA: Insufficient documentation

## 2019-12-01 DIAGNOSIS — D649 Anemia, unspecified: Secondary | ICD-10-CM | POA: Insufficient documentation

## 2019-12-01 LAB — CBC WITH DIFFERENTIAL/PLATELET
Abs Immature Granulocytes: 0.06 10*3/uL (ref 0.00–0.07)
Basophils Absolute: 0 10*3/uL (ref 0.0–0.1)
Basophils Relative: 0 %
Eosinophils Absolute: 0 10*3/uL (ref 0.0–0.5)
Eosinophils Relative: 0 %
HCT: 27.9 % — ABNORMAL LOW (ref 36.0–46.0)
Hemoglobin: 8.4 g/dL — ABNORMAL LOW (ref 12.0–15.0)
Immature Granulocytes: 1 %
Lymphocytes Relative: 33 %
Lymphs Abs: 2.4 10*3/uL (ref 0.7–4.0)
MCH: 27.4 pg (ref 26.0–34.0)
MCHC: 30.1 g/dL (ref 30.0–36.0)
MCV: 90.9 fL (ref 80.0–100.0)
Monocytes Absolute: 0.7 10*3/uL (ref 0.1–1.0)
Monocytes Relative: 10 %
Neutro Abs: 4.1 10*3/uL (ref 1.7–7.7)
Neutrophils Relative %: 56 %
Platelets: 298 10*3/uL (ref 150–400)
RBC: 3.07 MIL/uL — ABNORMAL LOW (ref 3.87–5.11)
RDW: 16.3 % — ABNORMAL HIGH (ref 11.5–15.5)
WBC: 7.1 10*3/uL (ref 4.0–10.5)
nRBC: 0 % (ref 0.0–0.2)

## 2019-12-01 MED ORDER — FERROUS SULFATE 325 (65 FE) MG PO TABS: 325.0000 mg | ORAL_TABLET | Freq: Two times a day (BID) | ORAL | 0 refills | Status: DC

## 2019-12-01 MED ORDER — IBUPROFEN 800 MG PO TABS
800.0000 mg | ORAL_TABLET | Freq: Once | ORAL | Status: AC
  Administered 2019-12-01: 800 mg via ORAL
  Filled 2019-12-01: qty 1

## 2019-12-01 NOTE — MAU Provider Note (Signed)
History     CSN: [id number]  Arrival date and time: 12/01/19 2224   First Provider Initiated Contact with Patient 12/01/19 2253      Chief Complaint  Patient presents with  . Vaginal Bleeding   Erica Barber is a 24 y.o. G3P3 who is 1 week PP from a SVD on 11/24/19. Patient presents to MAU with complaints of vaginal bleeding. Patient reports passing 1 blood clot 2 hours ago. She reports the blood clot was the size of a ping pong ball. She denies passing any additional clots or having an increase in vaginal bleeding prior or since. She denies increased abdominal pain or cramping. Patient reports having back pain that has been present since epidural placement. Patient rates back pain as 4/10- has not taken any medication for pain today. Last took Ibuprofen yesterday which she reports the medication helps.    OB History    Gravida  3   Para  3   Term  3   Preterm      AB      Living  3     SAB      TAB      Ectopic      Multiple  0   Live Births  3           Past Medical History:  Diagnosis Date  . Asthma     Past Surgical History:  Procedure Laterality Date  . NO PAST SURGERIES      Family History  Problem Relation Age of Onset  . Hypertension Father   . Hypertension Paternal Grandmother     Social History   Tobacco Use  . Smoking status: Never Smoker  . Smokeless tobacco: Never Used  Substance Use Topics  . Alcohol use: No  . Drug use: Not Currently    Types: Marijuana    Comment: not since she has been pregnant    Allergies: No Known Allergies  Medications Prior to Admission  Medication Sig Dispense Refill Last Dose  . albuterol (VENTOLIN HFA) 108 (90 Base) MCG/ACT inhaler Inhale 2 puffs into the lungs every 6 (six) hours as needed for wheezing or shortness of breath. 8 g 1 12/01/2019 at Unknown time  . ferrous sulfate 325 (65 FE) MG tablet Take 1 tablet (325 mg total) by mouth daily with breakfast. 30 tablet 0 12/01/2019 at Unknown time   . ibuprofen (ADVIL) 600 MG tablet Take 1 tablet (600 mg total) by mouth every 6 (six) hours. 30 tablet 0 11/30/2019 at Unknown time  . Prenatal MV-Min-FA-Omega-3 (PRENATAL GUMMIES/DHA & FA) 0.4-32.5 MG CHEW Chew 1 each by mouth at bedtime. 90 tablet 3 12/01/2019 at Unknown time  . Blood Pressure Monitor KIT 1 Device by Does not apply route once a week. To be monitored Regularly at home. 1 kit 0 Unknown at Unknown time  . senna-docusate (SENOKOT-S) 8.6-50 MG tablet Take 2 tablets by mouth daily. 30 tablet 0 Unknown at Unknown time    Review of Systems  Constitutional: Negative.   Respiratory: Negative.   Cardiovascular: Negative.   Gastrointestinal: Negative.   Genitourinary: Positive for vaginal bleeding. Negative for difficulty urinating, dysuria, frequency, pelvic pain and urgency.  Musculoskeletal: Positive for back pain.  Neurological: Negative.   Psychiatric/Behavioral: Negative.    Physical Exam   Blood pressure 124/77, pulse 92, temperature 98.3 F (36.8 C), temperature source Oral, resp. rate 17, unknown if currently breastfeeding.  Physical Exam  Constitutional: She is oriented to person, place, and  time. She appears well-developed and well-nourished. No distress.  Cardiovascular: Normal rate and regular rhythm.  Respiratory: Effort normal and breath sounds normal. No respiratory distress. She has no wheezes.  GI: Soft. There is no abdominal tenderness. There is no rebound and no guarding.  Fundus firm, non tender, U/4   Genitourinary:    Genitourinary Comments: Smear of vaginal bleeding noted on pad, no clots, no increased bleeding with fundal rub.    Musculoskeletal:        General: No edema. Normal range of motion.  Neurological: She is alert and oriented to person, place, and time.  Psychiatric: She has a normal mood and affect. Her behavior is normal. Thought content normal.    MAU Course  Procedures  MDM CBC  Ibuprofen 879m for back pain   Reassessment @  21751 patient reports back pain is resolved after treatment with Ibuprofen.  CBC reviewed- Hgb 8.4, patient reports she is currently taking iron supplement once daily. Discussed to increase supplementation to BID with meals and encouraged to take iron supplementation with vitamin C for better absorption, patient verbalizes understanding.   Discussed with patient postpartum period and what to expect with vaginal bleeding. Encouraged to follow up as scheduled for postpartum examination in the office and return to MAU as needed. Pt stable at time of discharge.   Assessment and Plan   1. Postpartum care and examination   2. Anemia of mother during pregnancy, delivered    Discharge home Follow up as scheduled in the office for postpartum care and examination  Return to MAU as needed for reasons discussed and/or emergencies  Ferrous sulfate tablet BID with meals   [place]   Specialty: Obstetrics and Gynecology Why: Follow up as scheduled on 6/10 for postpartum care. Return to MAU as needed  Contact information: [address] [phone]        Allergies as of 12/01/2019   No Known Allergies     Medication List    TAKE these medications   albuterol 108 (90 Base) MCG/ACT inhaler Commonly known as: VENTOLIN HFA Inhale 2 puffs into the lungs every 6 (six) hours as needed for wheezing or shortness of breath.   Blood Pressure Monitor Kit 1 Device by Does not apply route once a week. To be monitored Regularly at home.   ferrous sulfate 325 (65 FE) MG tablet Take 1 tablet (325 mg total) by mouth 2 (two) times daily with a meal. What changed: when to take this   ibuprofen 600 MG tablet Commonly known as: ADVIL Take 1 tablet (600 mg total) by mouth every 6 (six) hours.   Prenatal Gummies/DHA & FA 0.4-32.5 MG Chew Chew 1 each by mouth at bedtime.   senna-docusate  8.6-50 MG tablet Commonly known as: Senokot-S Take 2 tablets by mouth daily.       VLajean ManesCNM 12/01/2019, 11:55 PM

## 2019-12-01 NOTE — Discharge Instructions (Signed)
Anemia  Anemia is a condition in which you do not have enough red blood cells or hemoglobin. Hemoglobin is a substance in red blood cells that carries oxygen. When you do not have enough red blood cells or hemoglobin (are anemic), your body cannot get enough oxygen and your organs may not work properly. As a result, you may feel very tired or have other problems. What are the causes? Common causes of anemia include:  Excessive bleeding. Anemia can be caused by excessive bleeding inside or outside the body, including bleeding from the intestine or from periods in women.  Poor nutrition.  Long-lasting (chronic) kidney, thyroid, and liver disease.  Bone marrow disorders.  Cancer and treatments for cancer.  HIV (human immunodeficiency virus) and AIDS (acquired immunodeficiency syndrome).  Treatments for HIV and AIDS.  Spleen problems.  Blood disorders.  Infections, medicines, and autoimmune disorders that destroy red blood cells. What are the signs or symptoms? Symptoms of this condition include:  Minor weakness.  Dizziness.  Headache.  Feeling heartbeats that are irregular or faster than normal (palpitations).  Shortness of breath, especially with exercise.  Paleness.  Cold sensitivity.  Indigestion.  Nausea.  Difficulty sleeping.  Difficulty concentrating. Symptoms may occur suddenly or develop slowly. If your anemia is mild, you may not have symptoms. How is this diagnosed? This condition is diagnosed based on:  Blood tests.  Your medical history.  A physical exam.  Bone marrow biopsy. Your health care provider may also check your stool (feces) for blood and may do additional testing to look for the cause of your bleeding. You may also have other tests, including:  Imaging tests, such as a CT scan or MRI.  Endoscopy.  Colonoscopy. How is this treated? Treatment for this condition depends on the cause. If you continue to lose a lot of blood, you may  need to be treated at a hospital. Treatment may include:  Taking supplements of iron, vitamin S31, or folic acid.  Taking a hormone medicine (erythropoietin) that can help to stimulate red blood cell growth.  Having a blood transfusion. This may be needed if you lose a lot of blood.  Making changes to your diet.  Having surgery to remove your spleen. Follow these instructions at home:  Take over-the-counter and prescription medicines only as told by your health care provider.  Take supplements only as told by your health care provider.  Follow any diet instructions that you were given.  Keep all follow-up visits as told by your health care provider. This is important. Contact a health care provider if:  You develop new bleeding anywhere in the body. Get help right away if:  You are very weak.  You are short of breath.  You have pain in your abdomen or chest.  You are dizzy or feel faint.  You have trouble concentrating.  You have bloody or black, tarry stools.  You vomit repeatedly or you vomit up blood. Summary  Anemia is a condition in which you do not have enough red blood cells or enough of a substance in your red blood cells that carries oxygen (hemoglobin).  Symptoms may occur suddenly or develop slowly.  If your anemia is mild, you may not have symptoms.  This condition is diagnosed with blood tests as well as a medical history and physical exam. Other tests may be needed.  Treatment for this condition depends on the cause of the anemia. This information is not intended to replace advice given to you by  your health care provider. Make sure you discuss any questions you have with your health care provider. Document Revised: 06/16/2017 Document Reviewed: 08/05/2016 Elsevier Patient Education  2020 [place].   Postpartum Care After Vaginal Delivery This sheet gives you information about how to care for yourself from the time you deliver your baby to up  to 6-12 weeks after delivery (postpartum period). Your health care provider may also give you more specific instructions. If you have problems or questions, contact your health care provider. Follow these instructions at home: Vaginal bleeding  It is normal to have vaginal bleeding (lochia) after delivery. Wear a sanitary pad for vaginal bleeding and discharge. ? During the first week after delivery, the amount and appearance of lochia is often similar to a menstrual period. ? Over the next few weeks, it will gradually decrease to a dry, yellow-brown discharge. ? For most women, lochia stops completely by 4-6 weeks after delivery. Vaginal bleeding can vary from woman to woman.  Change your sanitary pads frequently. Watch for any changes in your flow, such as: ? A sudden increase in volume. ? A change in color. ? Large blood clots.  If you pass a blood clot from your vagina, save it and call your health care provider to discuss. Do not flush blood clots down the toilet before talking with your health care provider.  Do not use tampons or douches until your health care provider says this is safe.  If you are not breastfeeding, your period should return 6-8 weeks after delivery. If you are feeding your child breast milk only (exclusive breastfeeding), your period may not return until you stop breastfeeding. Perineal care  Keep the area between the vagina and the anus (perineum) clean and dry as told by your health care provider. Use medicated pads and pain-relieving sprays and creams as directed.  If you had a cut in the perineum (episiotomy) or a tear in the vagina, check the area for signs of infection until you are healed. Check for: ? More redness, swelling, or pain. ? Fluid or blood coming from the cut or tear. ? Warmth. ? Pus or a bad smell.  You may be given a squirt bottle to use instead of wiping to clean the perineum area after you go to the bathroom. As you start healing, you may  use the squirt bottle before wiping yourself. Make sure to wipe gently.  To relieve pain caused by an episiotomy, a tear in the vagina, or swollen veins in the anus (hemorrhoids), try taking a warm sitz bath 2-3 times a day. A sitz bath is a warm water bath that is taken while you are sitting down. The water should only come up to your hips and should cover your buttocks. Breast care  Within the first few days after delivery, your breasts may feel heavy, full, and uncomfortable (breast engorgement). Milk may also leak from your breasts. Your health care provider can suggest ways to help relieve the discomfort. Breast engorgement should go away within a few days.  If you are breastfeeding: ? Wear a bra that supports your breasts and fits you well. ? Keep your nipples clean and dry. Apply creams and ointments as told by your health care provider. ? You may need to use breast pads to absorb milk that leaks from your breasts. ? You may have uterine contractions every time you breastfeed for up to several weeks after delivery. Uterine contractions help your uterus return to its normal size. ?  If you have any problems with breastfeeding, work with your health care provider or Science writer.  If you are not breastfeeding: ? Avoid touching your breasts a lot. Doing this can make your breasts produce more milk. ? Wear a good-fitting bra and use cold packs to help with swelling. ? Do not squeeze out (express) milk. This causes you to make more milk. Intimacy and sexuality  Ask your health care provider when you can engage in sexual activity. This may depend on: ? Your risk of infection. ? How fast you are healing. ? Your comfort and desire to engage in sexual activity.  You are able to get pregnant after delivery, even if you have not had your period. If desired, talk with your health care provider about methods of birth control (contraception). Medicines  Take over-the-counter and  prescription medicines only as told by your health care provider.  If you were prescribed an antibiotic medicine, take it as told by your health care provider. Do not stop taking the antibiotic even if you start to feel better. Activity  Gradually return to your normal activities as told by your health care provider. Ask your health care provider what activities are safe for you.  Rest as much as possible. Try to rest or take a nap while your baby is sleeping. Eating and drinking   Drink enough fluid to keep your urine pale yellow.  Eat high-fiber foods every day. These may help prevent or relieve constipation. High-fiber foods include: ? Whole grain cereals and breads. ? Brown rice. ? Beans. ? Fresh fruits and vegetables.  Do not try to lose weight quickly by cutting back on calories.  Take your prenatal vitamins until your postpartum checkup or until your health care provider tells you it is okay to stop. Lifestyle  Do not use any products that contain nicotine or tobacco, such as cigarettes and e-cigarettes. If you need help quitting, ask your health care provider.  Do not drink alcohol, especially if you are breastfeeding. General instructions  Keep all follow-up visits for you and your baby as told by your health care provider. Most women visit their health care provider for a postpartum checkup within the first 3-6 weeks after delivery. Contact a health care provider if:  You feel unable to cope with the changes that your child brings to your life, and these feelings do not go away.  You feel unusually sad or worried.  Your breasts become red, painful, or hard.  You have a fever.  You have trouble holding urine or keeping urine from leaking.  You have little or no interest in activities you used to enjoy.  You have not breastfed at all and you have not had a menstrual period for 12 weeks after delivery.  You have stopped breastfeeding and you have not had a  menstrual period for 12 weeks after you stopped breastfeeding.  You have questions about caring for yourself or your baby.  You pass a blood clot from your vagina. Get help right away if:  You have chest pain.  You have difficulty breathing.  You have sudden, severe leg pain.  You have severe pain or cramping in your lower abdomen.  You bleed from your vagina so much that you fill more than one sanitary pad in one hour. Bleeding should not be heavier than your heaviest period.  You develop a severe headache.  You faint.  You have blurred vision or spots in your vision.  You have bad-smelling  vaginal discharge.  You have thoughts about hurting yourself or your baby. If you ever feel like you may hurt yourself or others, or have thoughts about taking your own life, get help right away. You can go to the nearest emergency department or call:  Your local emergency services (911 in the [place]  A suicide crisis helpline, such as the [facility] at (701) 603-1152. This is open 24 hours a day. Summary  The period of time right after you deliver your newborn up to 6-12 weeks after delivery is called the postpartum period.  Gradually return to your normal activities as told by your health care provider.  Keep all follow-up visits for you and your baby as told by your health care provider. This information is not intended to replace advice given to you by your health care provider. Make sure you discuss any questions you have with your health care provider. Document Revised: 07/07/2017 Document Reviewed: 04/17/2017 Elsevier Patient Education  2020 [employer].

## 2019-12-01 NOTE — MAU Note (Signed)
Pt reports to MAU stating about 1 hour ago she passed a ping pong ball sized clot. Pt reports she has used 3 pads which has been consistent since she was discharged. Pt reports she has pain in her lower back that is a 5/10 at the location of her epidural site. Pt reports a intermittent HA but does not currently have one. Pt reports some dizziness today.

## 2019-12-14 IMAGING — US US MFM OB LIMITED
1 series · 15 of 26 positions shown · non-contrast
Comparison: none

[Series 1: us mfm ob limited · 15 of 26 slices shown]
[im 1/26]
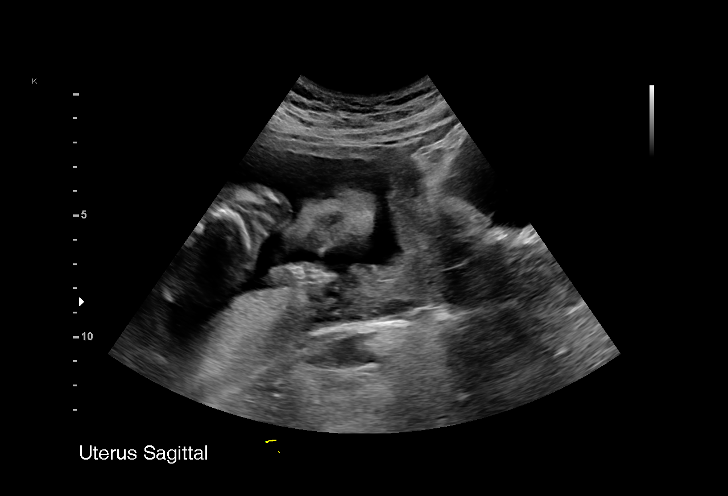
[im 3/26]
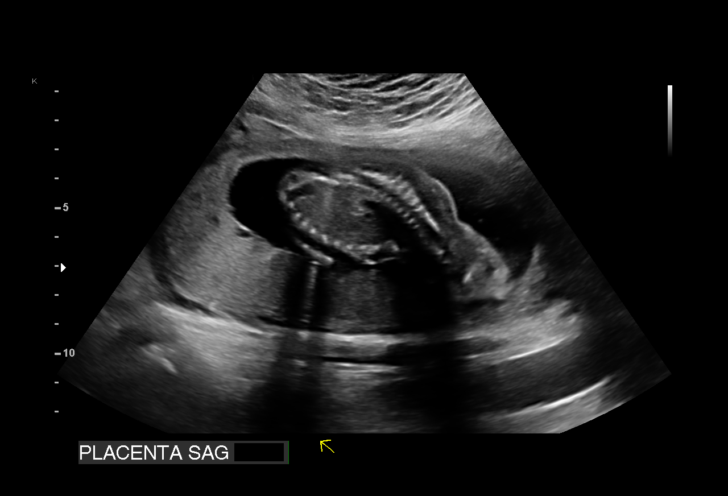
[im 5/26]
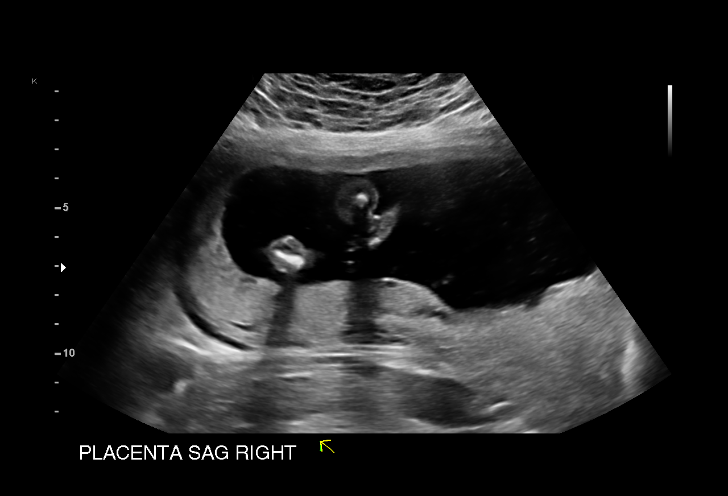
[im 7/26]
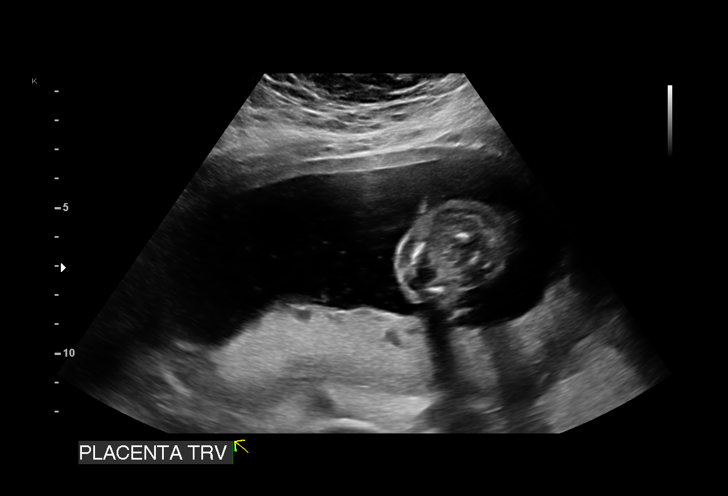
[im 8/26]
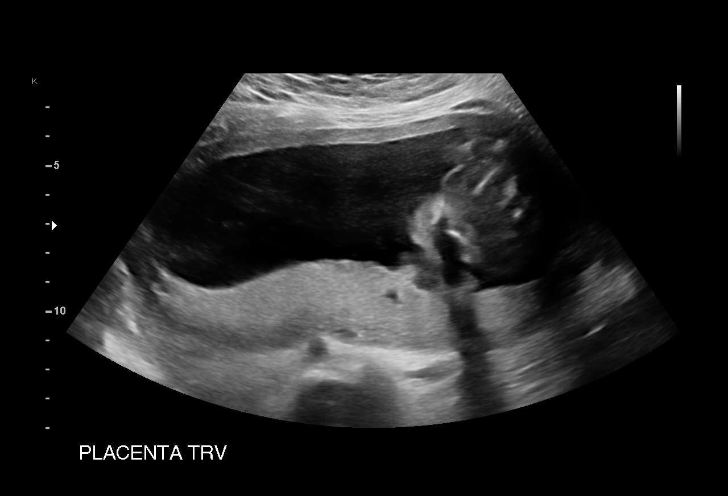
[im 10/26]
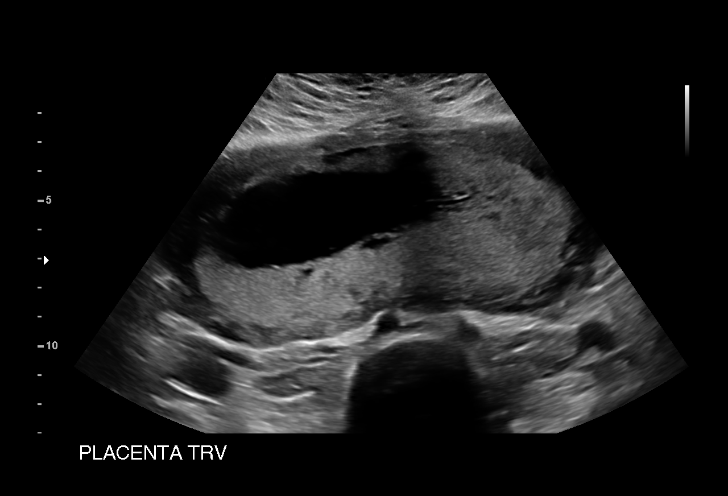
[im 12/26]
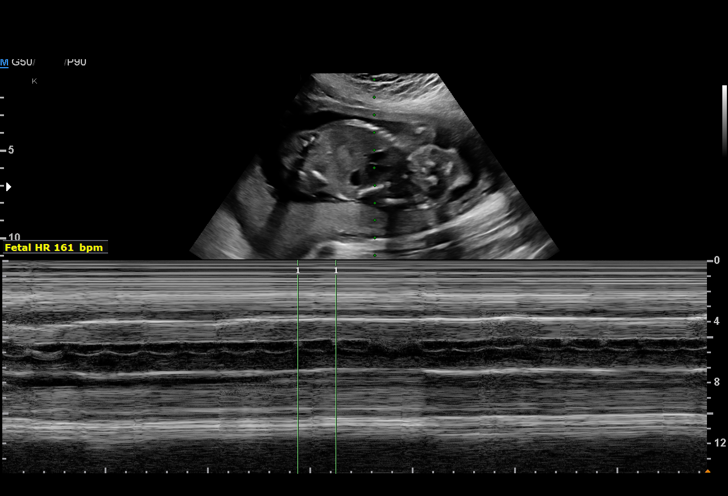
[im 14/26]
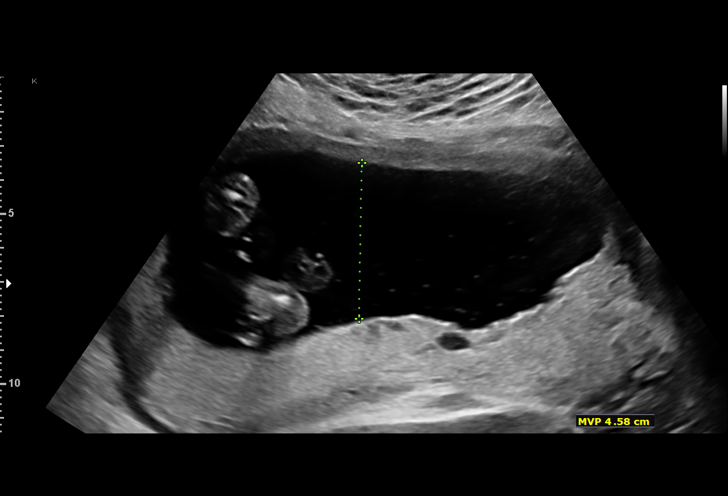
[im 15/26]
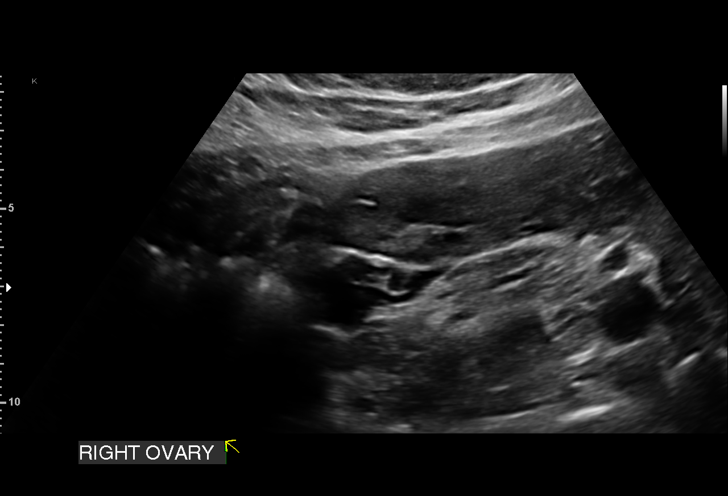
[im 17/26]
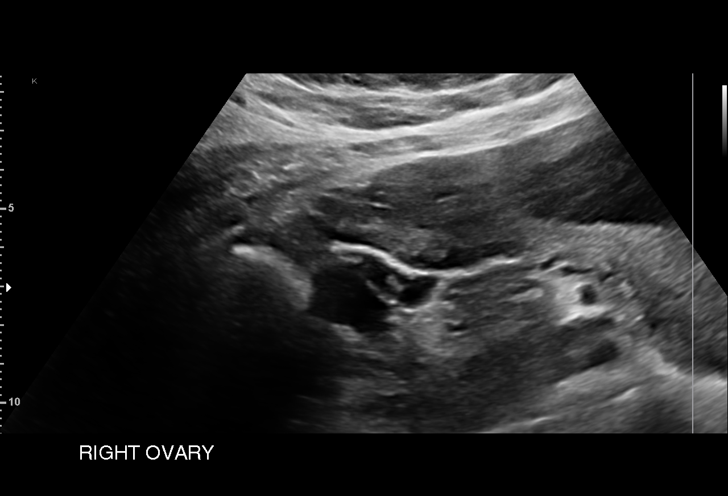
[im 19/26]
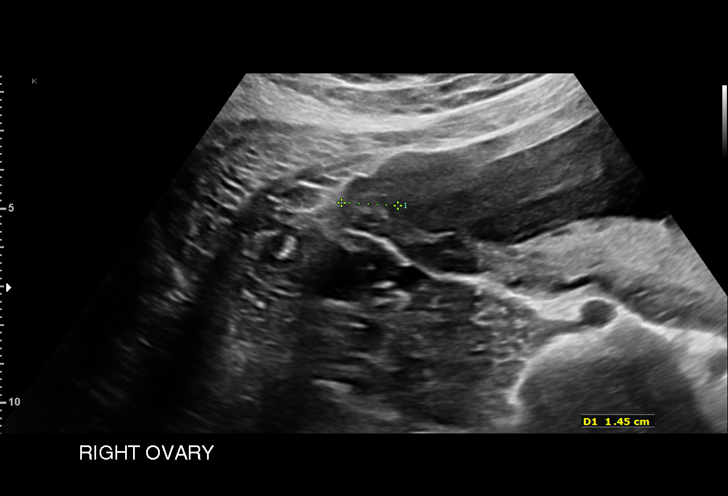
[im 20/26]
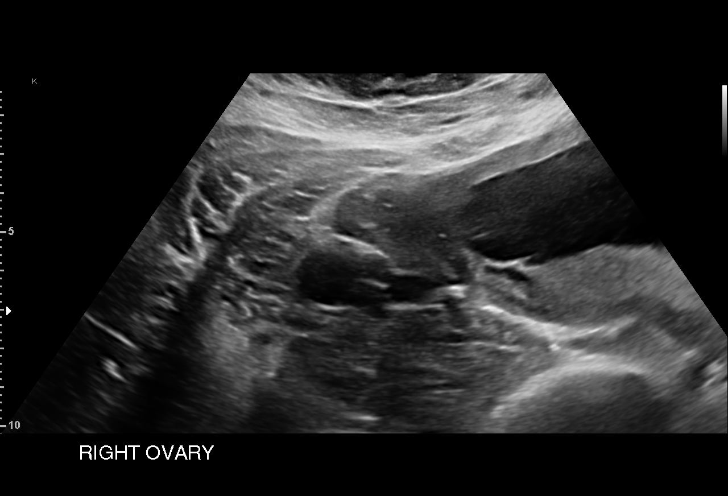
[im 22/26]
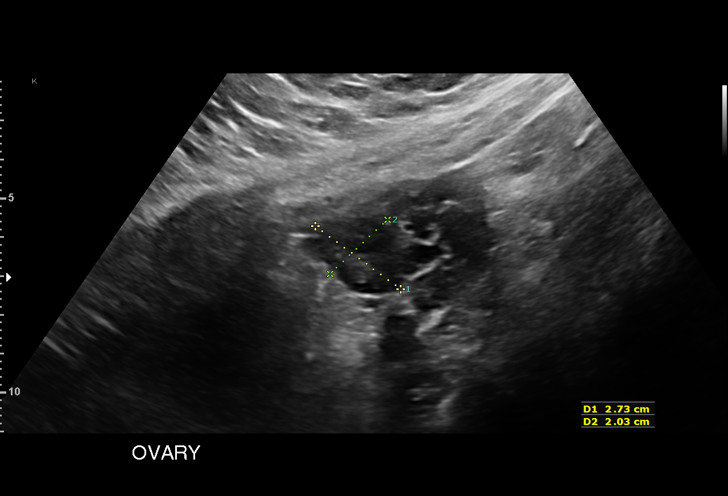
[im 24/26]
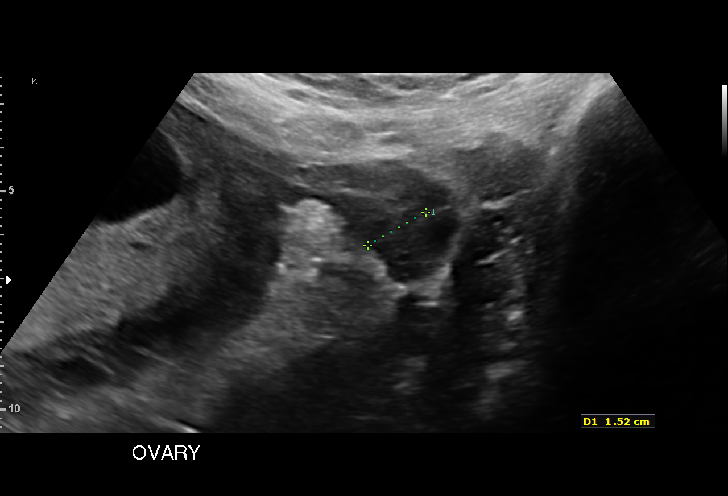
[im 26/26]
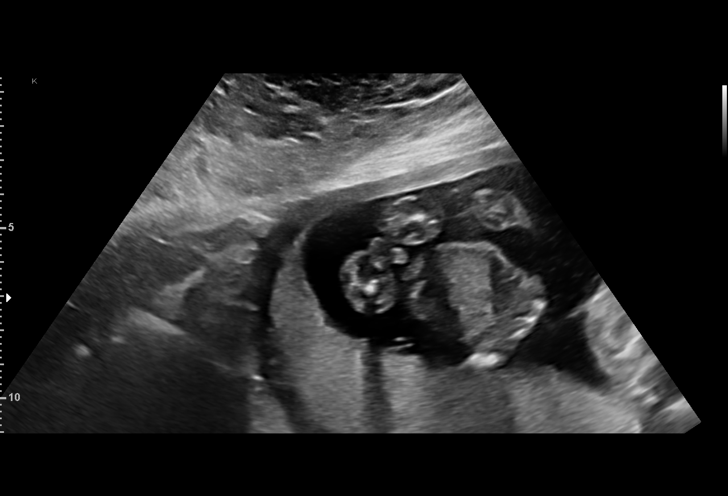

[15 of 26 positions shown; findings below may reference images not displayed]

1  US MFM OB LIMITED                    76815.01     JOBAYDA GUJER
 ----------------------------------------------------------------------

 ----------------------------------------------------------------------
Indications

  Abdominal pain in pregnancy
  19 weeks gestation of pregnancy
  Obesity complicating pregnancy, second
  trimester
 ----------------------------------------------------------------------
Fetal Evaluation

 Num Of Fetuses:         1
 Fetal Heart Rate(bpm):  161
 Cardiac Activity:       Observed
 Presentation:           Cephalic
 Placenta:               Posterior

 Amniotic Fluid
 AFI FV:      Within normal limits

                             Largest Pocket(cm)

Biometry

 BPD:      44.3  mm     G. Age:  19w 3d         42  %    CI:        70.72   %    70 - 86
                                                         FL/HC:      18.5   %    16.8 -
 HC:      167.9  mm     G. Age:  19w 3d         36  %
                                                         FL/BPD:     70.2   %
 FL:       31.1  mm     G. Age:  19w 5d         45  %
OB History

 Gravidity:    3         Term:   2
 Living:       2
Gestational Age
 LMP:           11w 1d        Date:  04/10/19                 EDD:   01/15/20
 U/S Today:     19w 4d                                        EDD:   11/17/19
 Best:          19w 4d     Det. By:  U/S (06/27/19)           EDD:   11/17/19
Cervix Uterus Adnexa

 Cervix
 Length:            3.7  cm.
 Normal appearance by transabdominal scan.

 Left Ovary
 Within normal limits.

 Right Ovary
 Within normal limits.
Impression

 Patient was evaluated for abdominal pain.
 A limited ultrasound study was performed. Amniotic fluid is
 normal and good fetal activity is seen. On transabdominal
 scan, the cervix looks long and closed.
 Placenta appears normal.
                 Aujla, Caleb

## 2019-12-23 NOTE — Progress Notes (Signed)
Pt requested note for job about her return to work date for her 6 week maternity leave she reports that her employer is allowing her to have. Note written and posted to pts mychart.

## 2019-12-26 ENCOUNTER — Ambulatory Visit (HOSPITAL_BASED_OUTPATIENT_CLINIC_OR_DEPARTMENT_OTHER): Payer: Medicaid Other

## 2019-12-26 ENCOUNTER — Other Ambulatory Visit: Payer: Self-pay

## 2019-12-26 DIAGNOSIS — F53 Postpartum depression: Secondary | ICD-10-CM

## 2019-12-26 MED ORDER — ETONOGESTREL-ETHINYL ESTRADIOL 0.12-0.015 MG/24HR VA RING: VAGINAL_RING | VAGINAL | 4 refills | Status: AC

## 2019-12-26 NOTE — Patient Instructions (Signed)
Laparoscopic Tubal Ligation Laparoscopic tubal ligation is a procedure to close the fallopian tubes. This is done so that you cannot get pregnant. When the fallopian tubes are closed, the eggs that your ovaries release cannot enter the uterus, and sperm cannot reach the released eggs. You should not have this procedure if you want to get pregnant someday or if you are unsure about having more children. Tell a health care provider about:  Any allergies you have.  All medicines you are taking, including vitamins, herbs, eye drops, creams, and over-the-counter medicines.  Any problems you or family members have had with anesthetic medicines.  Any blood disorders you have.  Any surgeries you have had.  Any medical conditions you have.  Whether you are pregnant or may be pregnant.  Any past pregnancies. What are the risks? Generally, this is a safe procedure. However, problems may occur, including:  Infection.  Bleeding.  Injury to other organs in the abdomen.  Side effects from anesthetic medicines.  Failure of the procedure. This procedure can increase your risk of a kind of pregnancy in which a fertilized egg attaches to the outside of the uterus (ectopic pregnancy). What happens before the procedure? Medicines  Ask your health care provider about: ? Changing or stopping your regular medicines. This is especially important if you are taking diabetes medicines or blood thinners. ? Taking medicines such as aspirin and ibuprofen. These medicines can thin your blood. Do not take these medicines unless your health care provider tells you to take them. ? Taking over-the-counter medicines, vitamins, herbs, and supplements. Staying hydrated  Follow instructions from your health care provider about hydration, which may include: ? Up to 2 hours before the procedure - you may continue to drink clear liquids, such as water, clear fruit juice, black coffee, and plain tea. Eating and  drinking  Follow instructions from your health care provider about eating and drinking, which may include: ? 8 hours before the procedure - stop eating heavy meals or foods, such as meat, fried foods, or fatty foods. ? 6 hours before the procedure - stop eating light meals or foods, such as toast or cereal. ? 6 hours before the procedure - stop drinking milk or drinks that contain milk. ? 2 hours before the procedure - stop drinking clear liquids. General instructions  Do not use any products that contain nicotine or tobacco for at least 4 weeks before the procedure. These products include cigarettes, e-cigarettes, and chewing tobacco. If you need help quitting, ask your health care provider.  Plan to have someone take you home from the hospital.  If you will be going home right after the procedure, plan to have someone with you for 24 hours.  Ask your health care provider: ? How your surgery site will be marked. ? What steps will be taken to help prevent infection. These may include:  Removing hair at the surgery site.  Washing skin with a germ-killing soap.  Taking antibiotic medicine. What happens during the procedure?      An IV will be inserted into one of your veins.  You will be given one or more of the following: ? A medicine to help you relax (sedative). ? A medicine to numb the area (local anesthetic). ? A medicine to make you fall asleep (general anesthetic). ? A medicine that is injected into an area of your body to numb everything below the injection site (regional anesthetic).  Your bladder may be emptied with a small tube (  catheter).  If you have been given a general anesthetic, a tube will be put down your throat to help you breathe.  Two small incisions will be made in your lower abdomen and near your belly button.  Your abdomen will be inflated with a gas. This will let the surgeon see better and will give the surgeon room to work.  A thin, lighted tube  (laparoscope) with a camera attached will be inserted into your abdomen through one of the incisions. Small instruments will be inserted through the other incision.  The fallopian tubes will be tied off, burned (cauterized), or blocked with a clip, ring, or clamp. A small portion in the center of each fallopian tube may be removed.  The gas will be released from the abdomen.  The incisions will be closed with stitches (sutures).  A bandage (dressing) will be placed over the incisions. The procedure may vary among health care providers and hospitals. What happens after the procedure?  Your blood pressure, heart rate, breathing rate, and blood oxygen level will be monitored until you leave the hospital.  You will be given medicine to help with pain, nausea, and vomiting as needed. Summary  Laparoscopic tubal ligation is a procedure that is done so that you cannot get pregnant.  You should not have this procedure if you want to get pregnant someday or if you are unsure about having more children.  The procedure is done using a thin, lighted tube (laparoscope) with a camera attached that will be inserted into your abdomen through an incision.  Follow instructions from your health care provider about eating and drinking before the procedure. This information is not intended to replace advice given to you by your health care provider. Make sure you discuss any questions you have with your health care provider. Document Revised: 12/11/2018 Document Reviewed: 05/29/2018 Elsevier Patient Education  2020 Elsevier Inc. Ethinyl Estradiol; Etonogestrel vaginal ring What is this medicine? ETHINYL ESTRADIOL; ETONOGESTREL (ETH in il es tra DYE ole; et oh noe JES trel) vaginal ring is a flexible, vaginal ring used as a contraceptive (birth control method). This medicine combines 2 types of female hormones, an estrogen and a progestin. This ring is used to prevent ovulation and pregnancy. Each ring is  effective for 1 month. This medicine may be used for other purposes; ask your health care provider or pharmacist if you have questions. COMMON BRAND NAME(S): EluRyng, NuvaRing What should I tell my health care provider before I take this medicine? They need to know if you have any of these conditions:  abnormal vaginal bleeding  blood vessel disease or blood clots  breast, cervical, endometrial, ovarian, liver, or uterine cancer  diabetes  gallbladder disease  having surgery  heart disease or recent heart attack  high blood pressure  high cholesterol or triglycerides  history of irregular heartbeat or heart valve problems  kidney disease  liver disease  migraine headaches  protein C deficiency  protein S deficiency  recently had a baby, miscarriage, or abortion  stroke  systemic lupus erythematosus (SLE)  tobacco smoker  your age is more than 23 years old  an unusual or allergic reaction to estrogens, progestins, other medicines, foods, dyes, or preservatives  pregnant or trying to get pregnant  breast-feeding How should I use this medicine? Insert the ring into your vagina as directed. Follow the directions on the prescription label. The ring will remain place for 3 weeks and is then removed for a 1-week break. A new  ring is inserted 1 week after the last ring was removed, on the same day of the week. Check often to make sure the ring is still in place. If the ring was out of the vagina for an unknown amount of time, you may not be protected from pregnancy. Perform a pregnancy test and call your doctor. Do not use more often than directed. A patient package insert for the product will be given with each prescription and refill. Read this sheet carefully each time. The sheet may change frequently. Contact your pediatrician regarding the use of this medicine in children. Special care may be needed. Overdosage: If you think you have taken too much of this medicine  contact a poison control center or emergency room at once. NOTE: This medicine is only for you. Do not share this medicine with others. What if I miss a dose? You will need to use the ring exactly as directed. It is very important to follow the schedule every cycle. If you do not use the ring as directed, you may not be protected from pregnancy. If the ring should slip out, is lost, or if you leave it in longer or shorter than you should, contact your health care professional for advice. What may interact with this medicine? Do not take this medicine with the following medications:  dasabuvir; ombitasvir; paritaprevir; ritonavir  ombitasvir; paritaprevir; ritonavir  vaginal lubricants or other vaginal products that are oil-based or silicone-based This medicine may also interact with the following medications:  acetaminophen  antibiotics or medicines for infections, especially rifampin, rifabutin, rifapentine, and griseofulvin, and possibly penicillins or tetracyclines  aprepitant or fosaprepitant  armodafinil  ascorbic acid (vitamin C)  barbiturate medicines, such as phenobarbital or primidone  bosentan  certain antiviral medicines for hepatitis, HIV or AIDS  certain medicines for cancer treatment  certain medicines for seizures like carbamazepine, clobazam, felbamate, lamotrigine, oxcarbazepine, phenytoin, rufinamide, topiramate  certain medicines for treating high cholesterol  cyclosporine  dantrolene  elagolix  flibanserin  grapefruit juice  lesinurad  medicines for diabetes  medicines to treat fungal infections, such as griseofulvin, miconazole, fluconazole, ketoconazole, itraconazole, posaconazole or voriconazole  mifepristone  mitotane  modafinil  morphine  mycophenolate  St. John's wort  tamoxifen  temazepam  theophylline or aminophylline  thyroid hormones  tizanidine  tranexamic acid  ulipristal  warfarin This list may not  describe all possible interactions. Give your health care provider a list of all the medicines, herbs, non-prescription drugs, or dietary supplements you use. Also tell them if you smoke, drink alcohol, or use illegal drugs. Some items may interact with your medicine. What should I watch for while using this medicine? Visit your doctor or health care professional for regular checks on your progress. You will need a regular breast and pelvic exam and Pap smear while on this medicine. Check with your doctor or health care professional to see if you need an additional method of contraception during the first cycle that you use this ring. Female condoms (made with natural rubber latex, polyisoprene, and polyurethane) and spermicides may be used. Do not use a diaphragm, cervical cap, or a female condom, as the ring can interfere with these birth control methods and their proper placement. If you have any reason to think you are pregnant, stop using this medicine right away and contact your doctor or health care professional. If you are using this medicine for hormone related problems, it may take several cycles of use to see improvement in your condition.  Smoking increases the risk of getting a blood clot or having a stroke while you are using hormonal birth control, especially if you are more than 24 years old. You are strongly advised not to smoke. Some women are prone to getting dark patches on the skin of the face (cholasma). Your risk of getting chloasma with this medicine is higher if you had chloasma during a pregnancy. Keep out of the sun. If you cannot avoid being in the sun, wear protective clothing and use sunscreen. Do not use sun lamps or tanning beds/booths. This medicine can make your body retain fluid, making your fingers, hands, or ankles swell. Your blood pressure can go up. Contact your doctor or health care professional if you feel you are retaining fluid. If you are going to have elective  surgery, you may need to stop using this medicine before the surgery. Consult your health care professional for advice. This medicine does not protect you against HIV infection (AIDS) or any other sexually transmitted diseases. What side effects may I notice from receiving this medicine? Side effects that you should report to your doctor or health care professional as soon as possible:  allergic reactions such as skin rash or itching, hives, swelling of the lips, mouth, tongue, or throat  depression  high blood pressure  migraines or severe, sudden headaches  signs and symptoms of a blood clot such as breathing problems; changes in vision; chest pain; severe, sudden headache; pain, swelling, warmth in the leg; trouble speaking; sudden numbness or weakness of the face, arm or leg  signs and symptoms of infection like fever or chills with dizziness and a sunburn-like rash, or pain or trouble passing urine  stomach pain  symptoms of vaginal infection like itching, irritation or unusual discharge  yellowing of the eyes or skin Side effects that usually do not require medical attention (report these to your doctor or health care professional if they continue or are bothersome):  acne  breast pain, tenderness  irregular vaginal bleeding or spotting, particularly during the first month of use  mild headache  nausea  painful periods  vomiting This list may not describe all possible side effects. Call your doctor for medical advice about side effects. You may report side effects to FDA at 1-800-FDA-1088. Where should I keep my medicine? Keep out of the reach of children. Store unopened medicine for up to 4 months at room temperature at 15 and 30 degrees C (59 and 86 degrees F). Protect from light. Do not store above 30 degrees C (86 degrees F). Throw away any unused medicine 4 months after the dispense date or the expiration date, whichever comes first. A ring may only be used for 1  cycle (1 month). After the 3-week cycle, a used ring is removed and should be placed in the re-closable foil pouch and discarded in the trash out of reach of children and pets. Do NOT flush down the toilet. NOTE: This sheet is a summary. It may not cover all possible information. If you have questions about this medicine, talk to your doctor, pharmacist, or health care provider.  2020 Elsevier/Gold Standard (2019-01-24 12:31:47)

## 2019-12-26 NOTE — Progress Notes (Signed)
[facility] Visit Note  Erica Barber is a 24 y.o. G26P3003 female who presents for a postpartum visit. She is 4 weeks postpartum following a normal spontaneous vaginal delivery.  I have fully reviewed the prenatal and intrapartum course. The delivery was at 7 gestational weeks.  Anesthesia: epidural. Postpartum course has been unremarkable. Baby is doing well. Baby is feeding by breast. Bleeding no bleeding. Bowel function is normal. Bladder function is normal. Patient is not sexually active. Contraception method is none. Postpartum depression screening: pt states she is alright .  EPDS = 12   Patient states "I have been having a lot on my mind."  She states she filled out an application about seeing a therapist. Patient endorses feelings of depression and states "I feel like it has been going on for years."  She endorses safety at home and denies DV/A.  She states she has no support at home.  She states the FOB is involved, but that they do not live together.   Patient states she went back to work a week after delivery, but works from home.   Pt wants to discuss BTL, but is considering an interim method while she waits.  .  The following portions of the patient's history were reviewed and updated as appropriate: allergies, current medications, past family history, past medical history, past social history, past surgical history and problem list.  Review of Systems Pertinent items noted in HPI and remainder of comprehensive ROS otherwise negative.    Objective:  unknown if currently breastfeeding.  General:  alert, cooperative and no distress   Breasts:  negative and Patient breastfeeding-No s/s of issues with nipples or latch.   Lungs: clear to auscultation bilaterally  Heart:  regular rate and rhythm  Abdomen: normal findings: soft, non-tender   Vulva:  not evaluated  Vagina: not evaluated  Cervix:  NA  Corpus: not examined  Adnexa:  not evaluated  Rectal Exam: Not performed.         Assessment:   4 Weeks Postpartum  Breastfeeding EPDS 12-Mild PPD No social support Desires BTL  Plan:   Essential components of care per ACOG recommendations:  1.  Mood and well being: Patient with negative depression screening today, but with significant increase since last visit. Reviewed local resources for support. Referral placed for ambulatory [facility] services.  - Patient does not use tobacco.  - hx of drug use? No    2. Infant care and feeding:  -Patient currently breastmilk feeding? Yes  If breastmilk feeding discussed return to work and pumping.  -Social determinants of health (SDOH) reviewed in [place]. No concerns.  3. Sexuality, contraception and birth spacing - Patient does not want a pregnancy in the next year.  Desired family size is 3 children.  - Reviewed forms of contraception in tiered fashion. Patient desired bilateral tubal ligation and papers signed today.  Was given script for Nuvaring and instructed to wait 7 days post placement before initiation of sexual activity.    -Message sent to [facility] for scheduling of BTL.  - Discussed birth spacing of 18 months  4. Sleep and fatigue -Encouraged family/partner/community support of 4 hrs of uninterrupted sleep to help with mood and fatigue  5. Physical Recovery  - Discussed patients delivery and complications-Patient states delivery was "good... he came straight out." She states she "would not say that,but I had a hard time breathing with pushing" when asked if she had an asthma attack.  - Patient had  a mild laceration, perineal healing reviewed. Patient expressed understanding - Patient has urinary incontinence? No - Patient is safe to resume physical activity today and sexual activity one week after nuvaring insertion.   6.  Health Maintenance - No pap smear on file.  Discussed need for pap at next visit with renewal of Nuvaring if BTL not completed.  -Otherwise will schedule for 3 months s/p BTL.   7. No  Chronic Disease - PCP follow up  Cherre Robins, CNM [facility] [employer]

## 2019-12-31 ENCOUNTER — Encounter: Payer: Medicaid Other | Admitting: Licensed Clinical Social Worker

## 2020-01-13 NOTE — BH Specialist Note (Signed)
Integrated Behavioral Health via Telemedicine Video (Caregility) Visit  01/13/2020 Erica Barber [id number]  Number of Integrated Behavioral Health visits: 1 Session Start time: 1:23  Session End time: 1:50 Total time: 27  Referring Provider: Gerrit Heck, CNM Type of Visit: Video Patient/Family location: Home [facility] Provider location: [facility]  All persons participating in visit: Patient Erica Barber and [facility] Damico Partin    Confirmed patient's address: Yes  Confirmed patient's phone number: Yes  Any changes to demographics: No   Confirmed patient's insurance: Yes  Any changes to patient's insurance: No   Discussed confidentiality: Yes   I connected with Erica Barber  by a video enabled telemedicine application (Caregility) and verified that I am speaking with the correct person using two identifiers.     I discussed the limitations of evaluation and management by telemedicine and the availability of in person appointments.  I discussed that the purpose of this visit is to provide behavioral health care while limiting exposure to the novel coronavirus.   Discussed there is a possibility of technology failure and discussed alternative modes of communication if that failure occurs.  I discussed that engaging in this virtual visit, they consent to the provision of behavioral healthcare and the services will be billed under their insurance.  Patient and/or legal guardian expressed understanding and consented to virtual visit: Yes   PRESENTING CONCERNS: Patient and/or family reports the following symptoms/concerns: Pt states her primary symptoms today are fatigue, low self-esteem, excessive worry, difficulty relaxing, dread, and work stress; pt says it helps to talk through her feelings, as well as  looking forward to reaching her future work goals.  Duration of problem: Increase postpartum; Severity of problem:  moderate  STRENGTHS (Protective Factors/Coping Skills): Supportive partner; future-oriented goals, self-aware  GOALS ADDRESSED: Patient will: 1.  Reduce symptoms of: anxiety, depression and stress  2.  Increase knowledge and/or ability of: healthy habits  3.  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   Patient may benefit from psychoeducation and brief therapeutic interventions regarding coping with symptoms of depression and anxiety .  PLAN: 1. Follow up with behavioral health clinician on: Two weeks 2. Behavioral recommendations:  -Continue taking iron pills as prescribed by medical provider (consider taking with small amount of orange juice instead of water) -Continue with plan to start phlebotomy course as soon as possible -Consider attending new mom support group at either [email or web address] or [email or web address] -Consider apps (on After Visit Summary) for additional self-care  3. Referral(s): Integrated Art gallery manager (In Clinic) and [employer]:  New mom support  I discussed the assessment and treatment plan with the patient and/or parent/guardian. They were provided an opportunity to ask questions and all were answered. They agreed with the plan and demonstrated an understanding of the instructions.   They were advised to call back or seek an in-person evaluation if the symptoms worsen or if the condition fails to improve as anticipated.  Valetta Close Erica Barber  Depression screen [facility] 2/9 01/27/2020  Decreased Interest 1  Down,  Depressed, Hopeless 1  PHQ - 2 Score 2  Altered sleeping 0  Tired, decreased energy 3  Change in appetite 0  Feeling bad or failure about yourself  3  Trouble concentrating 0  Moving slowly or fidgety/restless 0  Suicidal thoughts 0  PHQ-9 Score 8   GAD 7 : Generalized Anxiety Score 01/27/2020  Nervous, Anxious, on Edge 2  Control/stop worrying 3  Worry too much - different things 3  Trouble relaxing 3  Restless 0  Easily annoyed or irritable 0  Afraid - awful might happen 3  Total GAD 7 Score 14

## 2020-01-27 ENCOUNTER — Other Ambulatory Visit: Payer: Self-pay

## 2020-01-27 ENCOUNTER — Ambulatory Visit (INDEPENDENT_AMBULATORY_CARE_PROVIDER_SITE_OTHER): Payer: Medicaid Other | Admitting: Clinical

## 2020-01-27 DIAGNOSIS — O99345 Other mental disorders complicating the puerperium: Secondary | ICD-10-CM

## 2020-01-27 DIAGNOSIS — F4323 Adjustment disorder with mixed anxiety and depressed mood: Secondary | ICD-10-CM

## 2020-01-27 NOTE — Patient Instructions (Signed)
[facility] [address] [place], [place] [address] [phone] (main office) [phone] (Franshesca Chipman's office)  /Emotional Wellbeing Apps and Websites Here are a few free apps meant to help you to help yourself.  To find, try searching on the internet to see if the app is offered on Apple/Android devices. If your first choice doesn't come up on your device, the good news is that there are many choices! Play around with different apps to see which ones are helpful to you.    Calm This is an app meant to help increase calm feelings. Includes info, strategies, and tools for tracking your feelings.      Calm Harm  This app is meant to help with self-harm. Provides many 5-minute or 15-min coping strategies for doing instead of hurting yourself.       Healthy Minds Health Minds is a problem-solving tool to help deal with emotions and cope with stress you encounter wherever you are.      MindShift This app can help people cope with anxiety. Rather than trying to avoid anxiety, you can make an important shift and face it.      MY3  MY3 features a support system, safety plan and resources with the goal of offering a tool to use in a time of need.       My Life My Voice  This mood journal offers a simple solution for tracking your thoughts, feelings and moods. Animated emoticons can help identify your mood.       Relax Melodies Designed to help with sleep, on this app you can mix sounds and meditations for relaxation.      Smiling Mind Smiling Mind is meditation made easy: it's a simple tool that helps put a smile on your mind.        Stop, Breathe & Think  A friendly, simple guide for people through meditations for mindfulness and compassion.  Stop, Breathe and Think Kids Enter your current feelings and choose a "mission" to help you cope. Offers videos for certain moods instead of just sound recordings.       Team  Orange The goal of this tool is to help teens change how they think, act, and react. This app helps you focus on your own good feelings and experiences.      The Virtual Hope Box The Virtual Hope Box (VHB) contains simple tools to help patients with coping, relaxation, distraction, and positive thinking.     

## 2020-01-30 ENCOUNTER — Other Ambulatory Visit: Payer: Self-pay | Admitting: Obstetrics & Gynecology

## 2020-02-03 ENCOUNTER — Other Ambulatory Visit: Payer: Self-pay

## 2020-02-03 ENCOUNTER — Encounter (HOSPITAL_BASED_OUTPATIENT_CLINIC_OR_DEPARTMENT_OTHER): Payer: Self-pay | Admitting: Obstetrics & Gynecology

## 2020-02-03 NOTE — BH Specialist Note (Signed)
[facility] via Telemedicine Phone (Caregility) Visit  02/03/2020 Erica Barber [id number]  Number of [facility] visits: 2 Session Start time: 1:16  Session End time: 1:30 Total time: 14  Referring Provider: Gerrit Barber, CNM Type of Visit: Phone Patient/Family location: Home Erica Barber Provider location: [facility]  All persons participating in visit: Patient Erica Barber and Erica Barber Erica Barber    Confirmed patient's address: Yes  Confirmed patient's phone number: Yes  Any changes to demographics: No   Confirmed patient's insurance: Yes  Any changes to patient's insurance: No   Discussed confidentiality: at previous visit  I connected with Erica Barber by a video enabled telemedicine application (Caregility) and verified that I am speaking with the correct person using two identifiers.     I discussed the limitations of evaluation and management by telemedicine and the availability of in person appointments.  I discussed that the purpose of this visit is to provide behavioral health care while limiting exposure to the novel coronavirus.   Discussed there is a possibility of technology failure and discussed alternative modes of communication if that failure occurs.  I discussed that engaging in this virtual visit, they consent to the provision of behavioral healthcare and the services will be billed under their insurance.  Patient and/or legal guardian expressed understanding and consented to virtual visit: Yes   PRESENTING CONCERNS: Patient and/or family reports the following symptoms/concerns: Pt states she has mixed feelings about recent job loss (relief and financial concern); primary concern today is question about tubal ligation surgery tomorrow. Pt is taking iron as prescribed, and looking forward to moving towards her personal career goals.  Duration of problem: Increase postpartum ;  Severity of problem: mild  STRENGTHS (Protective Factors/Coping Skills): Supportive partner, future-oriented goals, self-awareness  GOALS ADDRESSED: Patient will: 1.  Reduce symptoms of: anxiety, depression and stress  2.  Increase knowledge and/or ability of: stress reduction  3.  Demonstrate ability to: Increase healthy adjustment to current life circumstances  INTERVENTIONS: Interventions utilized:  Supportive Counseling and Psychoeducation and/or Health Education Standardized Assessments completed: Not given today  ASSESSMENT: Patient currently experiencing Adjustment disorder with mixed anxiety and depression.   Patient may benefit from continued psychoeducation and brief therapeutic interventions regarding coping with symptoms of anxiety, depression, life stress .  PLAN: 1. Follow up with behavioral health clinician on : As needed 2. Behavioral recommendations:  -Continue to view job loss as a positive (opportunity to spend time with children, focus on future career goals) -Continue taking iron as prescribed -Continue plan to complete phlebotomy course to completion -Continue to consider new mom support group, at www.postpartum.net  3. Referral(s): [facility] [employer] (In Clinic)  I discussed the assessment and treatment plan with the patient and/or parent/guardian. They were provided an opportunity to ask questions and all were answered. They agreed with the plan and demonstrated an understanding of the instructions.   They were advised to call back or seek an in-person evaluation if the symptoms worsen or if the condition fails to improve as anticipated.  Erica Barber  Depression screen Erica Barber 2/9 01/27/2020  Decreased Interest 1  Down, Depressed, Hopeless 1  PHQ - 2 Score 2  Altered sleeping 0  Tired, decreased energy 3  Change in appetite 0  Feeling bad or failure about yourself  3  Trouble concentrating 0  Moving slowly or fidgety/restless 0   Suicidal thoughts 0  PHQ-9 Score 8  GAD 7 : Generalized Anxiety Score 01/27/2020  Nervous, Anxious, on Edge 2  Control/stop worrying 3  Worry too much - different things 3  Trouble relaxing 3  Restless 0  Easily annoyed or irritable 0  Afraid - awful might happen 3  Total GAD 7 Score 14

## 2020-02-07 ENCOUNTER — Inpatient Hospital Stay (HOSPITAL_COMMUNITY): Admission: RE | Admit: 2020-02-07 | Payer: Medicaid Other | Source: Ambulatory Visit

## 2020-02-10 ENCOUNTER — Ambulatory Visit (INDEPENDENT_AMBULATORY_CARE_PROVIDER_SITE_OTHER): Payer: Medicaid Other | Admitting: Clinical

## 2020-02-10 ENCOUNTER — Telehealth: Payer: Self-pay | Admitting: *Deleted

## 2020-02-10 ENCOUNTER — Encounter (HOSPITAL_COMMUNITY): Payer: Self-pay | Admitting: Anesthesiology

## 2020-02-10 DIAGNOSIS — F4323 Adjustment disorder with mixed anxiety and depressed mood: Secondary | ICD-10-CM

## 2020-02-10 DIAGNOSIS — O99345 Other mental disorders complicating the puerperium: Secondary | ICD-10-CM | POA: Diagnosis not present

## 2020-02-10 NOTE — Telephone Encounter (Addendum)
-----   Message from Rae Lips, [place] sent at 02/10/2020  1:34 PM EDT ----- Pt requests call back to find out if it is likely she will be able to return to school the [employer] after tubal ligation surgery or not. Surgery is tomorrow  7/26  1800  Called pt and discussed her concern. She stated that she is in a phlebotomy program and cannot miss her 4 hr class on the [employer] following surgery. She has already called to cancel the surgery for tomorrow however she would like to reschedule. She is continuing to use NuvaRing for birth control in the meantime.  I provided the telephone number to [facility] so that she can reschedule. Pt voiced understanding and expressed gratitude.

## 2020-02-10 NOTE — Patient Instructions (Signed)
[facility] [address] [place], [place] [address] [phone] (main office) [phone] (Derrian Poli's office)   

## 2020-02-10 NOTE — Progress Notes (Signed)
Pt states going for covid test today before 3 pm.

## 2020-02-11 ENCOUNTER — Ambulatory Visit (HOSPITAL_BASED_OUTPATIENT_CLINIC_OR_DEPARTMENT_OTHER)
Admission: RE | Admit: 2020-02-11 | Payer: Medicaid Other | Source: Home / Self Care | Admitting: Obstetrics & Gynecology

## 2020-02-11 SURGERY — LIGATION, FALLOPIAN TUBE, LAPAROSCOPIC
Anesthesia: Choice | Laterality: Bilateral

## 2020-04-04 IMAGING — US US MFM OB FOLLOW-UP
1 series · 14 of 28 positions shown · non-contrast
Comparison: none

[Series 1: us mfm ob follow-up · 39 acquisitions, 14 frames shown]
[im 2/39]
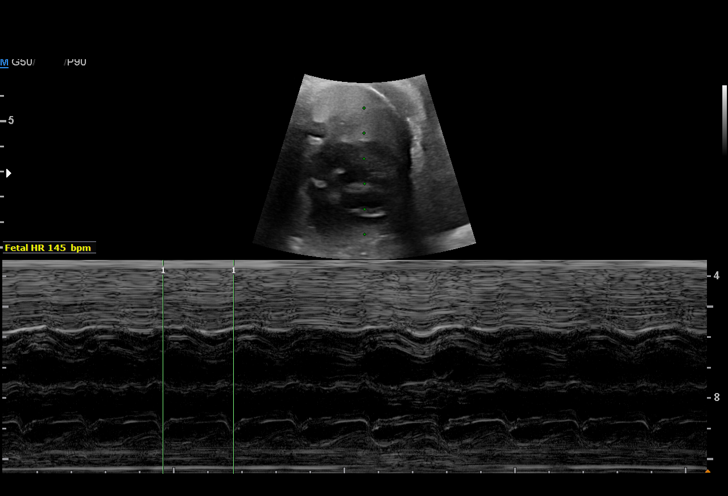
[im 5/39]
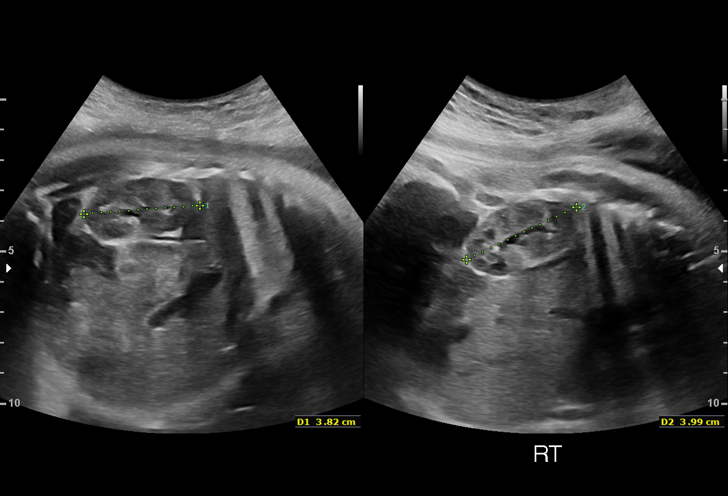
[im 8/39]
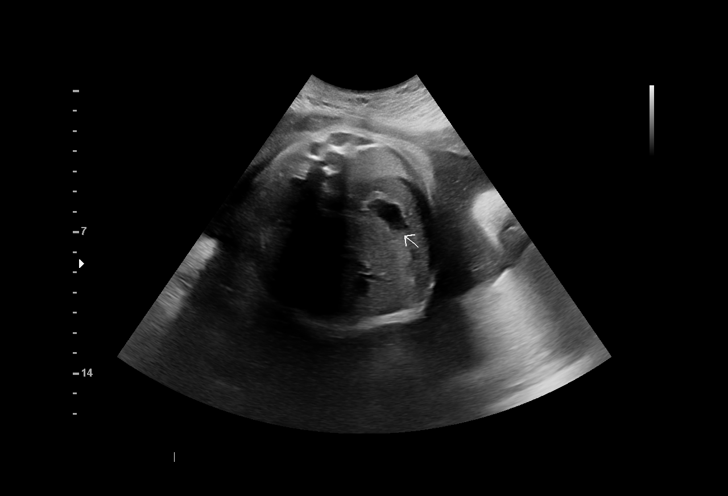
[im 10/39]
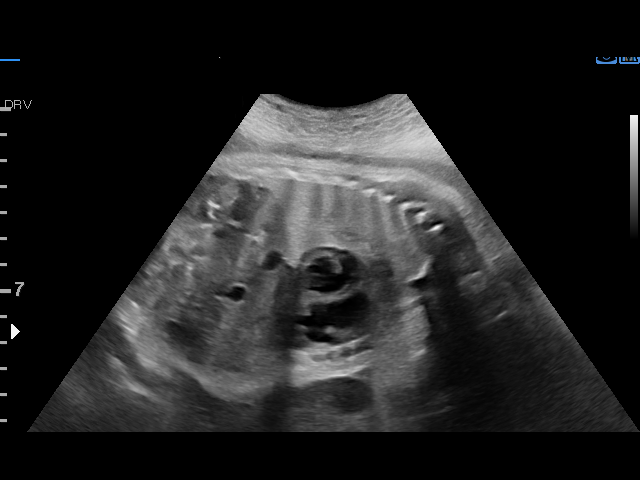
[im 13/39]
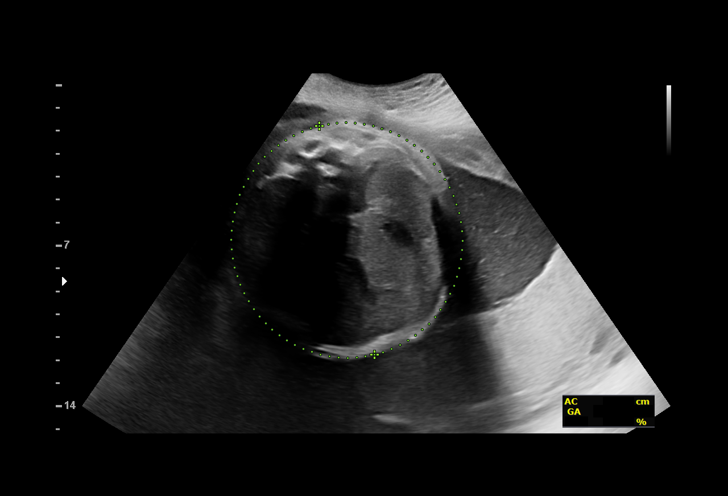
[im 16/39]
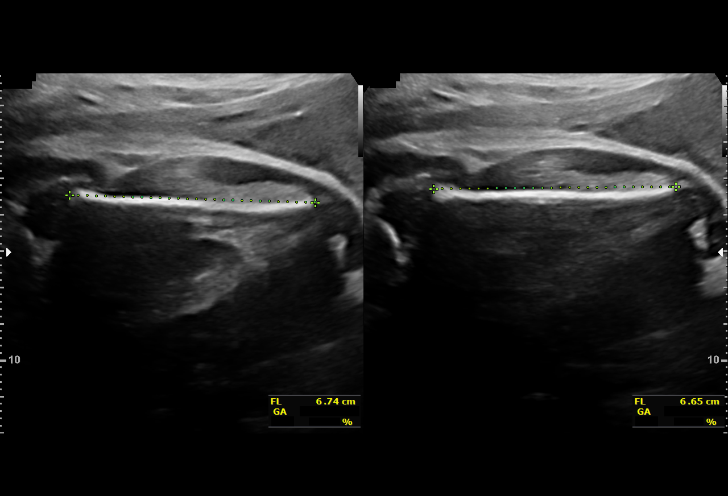
[im 19/39]
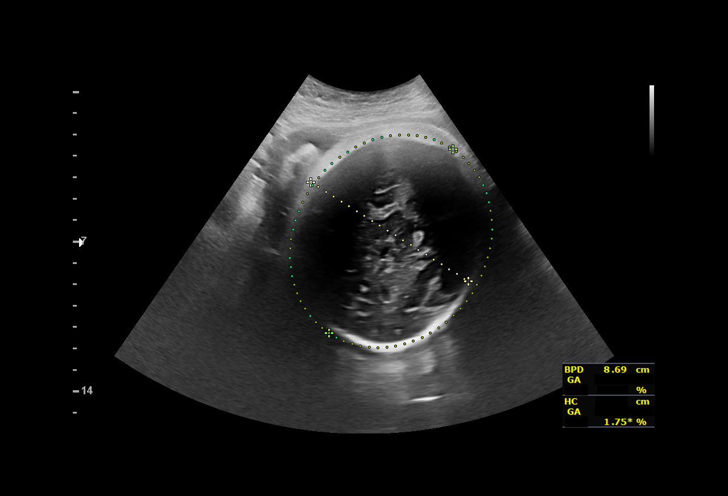
[im 22/39]
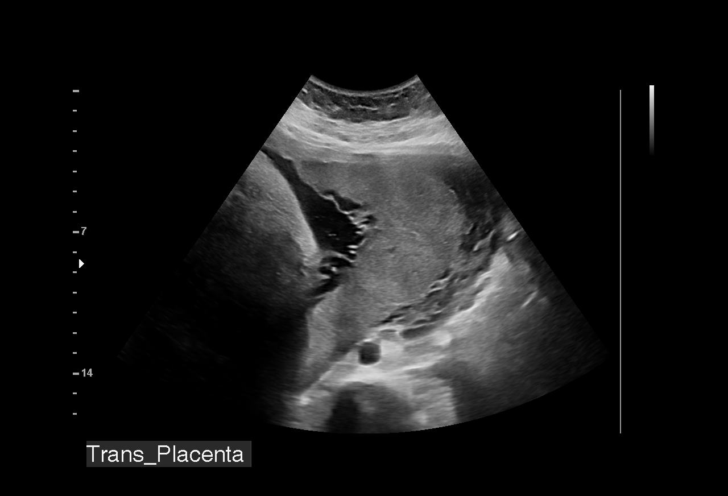
[im 24/39]
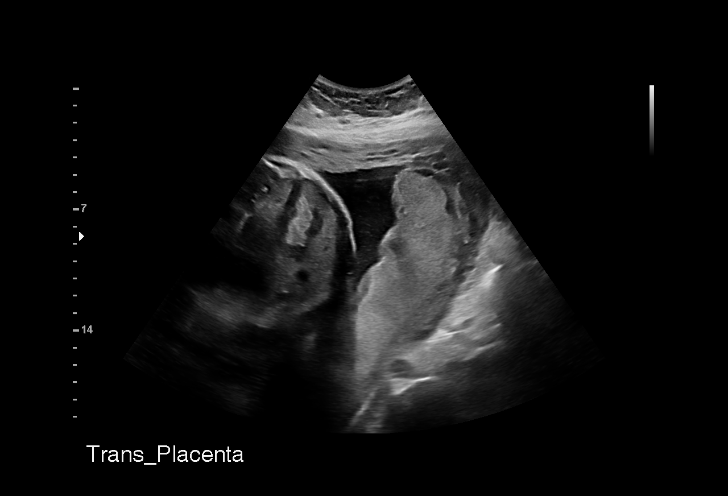
[im 27/39]
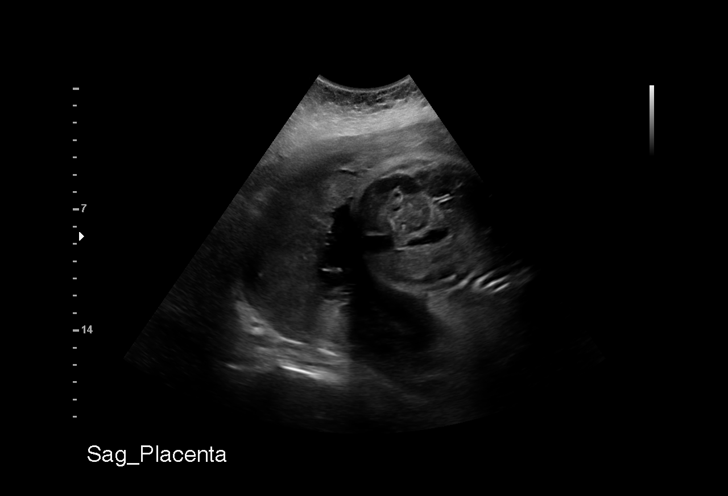
[im 30/39]
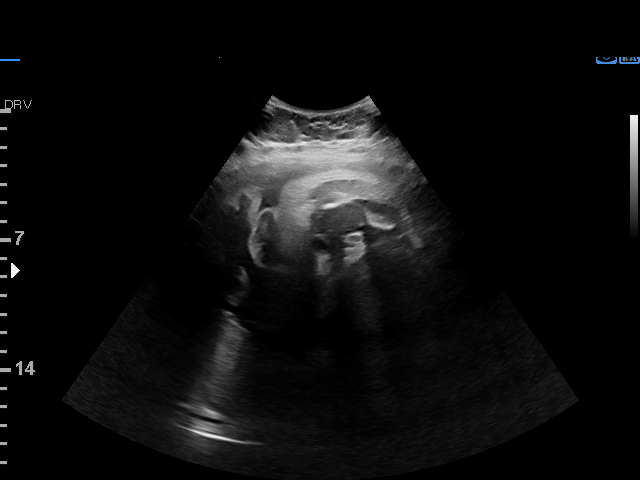
[im 33/39]
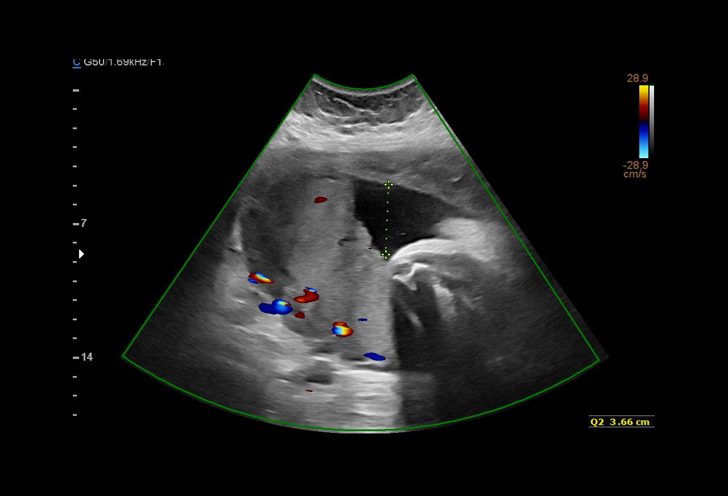
[im 36/39]
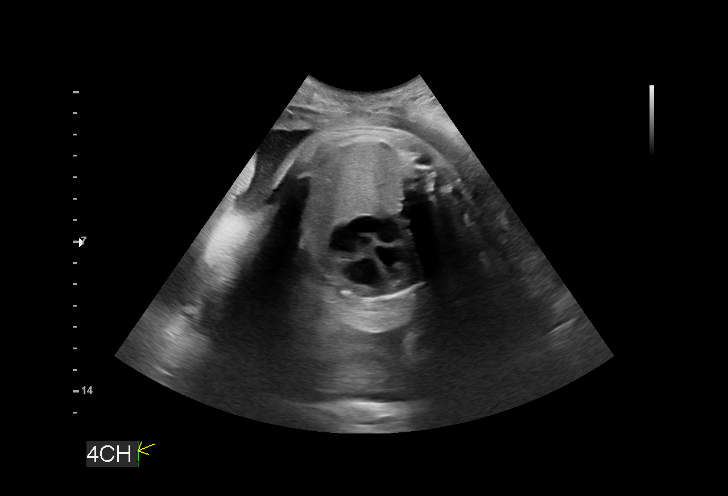
[im 39/39]
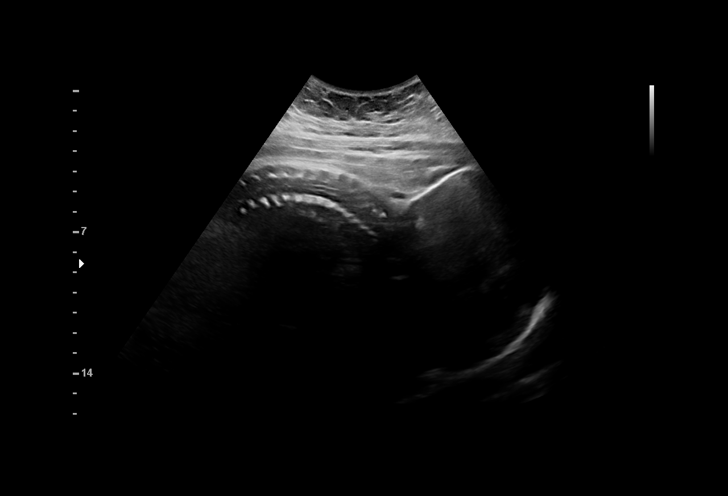

[14 of 28 positions shown; findings below may reference images not displayed]

----------------------------------------------------------------------

 ----------------------------------------------------------------------
Indications

  Obesity complicating pregnancy, third
  trimester
  Encounter for other antenatal screening
  follow-up
  35 weeks gestation of pregnancy
  Insufficient Prenatal Care
 ----------------------------------------------------------------------
Vital Signs

                                                Height:        5'3"
Fetal Evaluation

 Num Of Fetuses:         1
 Fetal Heart Rate(bpm):  145
 Cardiac Activity:       Observed
 Presentation:           Cephalic
 Placenta:               Posterior
 P. Cord Insertion:      Previously Visualized

 Amniotic Fluid
 AFI FV:      Within normal limits

 AFI Sum(cm)     %Tile       Largest Pocket(cm)
 13.01           44

 RUQ(cm)       RLQ(cm)       LUQ(cm)        LLQ(cm)

Biometry
 BPD:        85  mm     G. Age:  34w 2d         19  %    CI:        76.19   %    70 - 86
                                                         FL/HC:      21.7   %    20.1 -
 HC:      308.6  mm     G. Age:  34w 3d          5  %    HC/AC:      0.97        0.93 -
 AC:      318.8  mm     G. Age:  35w 6d         66  %    FL/BPD:     78.8   %    71 - 87
 FL:         67  mm     G. Age:  34w 3d         18  %    FL/AC:      21.0   %    20 - 24
 HUM:      58.5  mm     G. Age:  33w 6d         34  %

 Est. FW:    3299  gm    5 lb 12 oz      37  %
OB History

 Gravidity:    3         Term:   2
 Living:       2
Gestational Age

 LMP:           27w 1d        Date:  04/10/19                 EDD:   01/15/20
 U/S Today:     34w 5d                                        EDD:   11/23/19
 Best:          35w 4d     Det. By:  U/S  (06/27/19)          EDD:   11/17/19
Anatomy

 Cranium:               Appears normal         Aortic Arch:            Previously seen
 Cavum:                 Not well visualized    Ductal Arch:            Not well visualized
 Ventricles:            Previously seen        Diaphragm:              Appears normal
 Choroid Plexus:        Previously seen        Stomach:                Appears normal, left
                                                                       sided
 Cerebellum:            Previously seen        Abdomen:                Previously seen
 Posterior Fossa:       Previously seen        Abdominal Wall:         Previously seen
 Nuchal Fold:           Not applicable (>20    Cord Vessels:           Previously seen
                        wks GA)
 Face:                  Orbits and profile     Kidneys:                Appear normal
                        previously seen
 Lips:                  Previously seen        Bladder:                Appears normal
 Thoracic:              Appears normal         Spine:                  Previously seen
 Heart:                 Appears normal         Upper Extremities:      Prev Visualized
                        (4CH, axis, and
                        situs)
 RVOT:                  Previously seen        Lower Extremities:      Previously seen
 LVOT:                  Previously seen

 Other:  Heels and right 5th digit prev visualized. Nasal bone prev visualized.
Cervix Uterus Adnexa

 Cervix
 Not visualized (advanced GA >58wks)
Comments

 This patient was seen for a follow up growth scan to confirm
 her dates as she presented late for prenatal care.  She
 denies any problems in her current pregnancy.
 She was informed that the fetal growth today is consistent
 with an EDC November 17, 2019.  There was normal amniotic
 fluid noted on today's ultrasound.

 As the fetal growth appears appropriate for her gestational
 age, no further exams were scheduled in our office.

## 2020-08-21 ENCOUNTER — Other Ambulatory Visit: Payer: Self-pay

## 2020-08-21 ENCOUNTER — Ambulatory Visit
Admission: EM | Admit: 2020-08-21 | Discharge: 2020-08-21 | Disposition: A | Discharge status: Home / Self Care | Payer: Medicaid Other | Attending: Emergency Medicine | Admitting: Emergency Medicine

## 2020-08-21 DIAGNOSIS — L03011 Cellulitis of right finger: Secondary | ICD-10-CM

## 2020-08-21 MED ORDER — MUPIROCIN 2 % EX OINT
1.0000 | TOPICAL_OINTMENT | Freq: Two times a day (BID) | CUTANEOUS | 0 refills | Status: DC
Start: 2020-08-21 — End: 2023-02-20

## 2020-08-21 MED ORDER — IBUPROFEN 800 MG PO TABS
800.0000 mg | ORAL_TABLET | Freq: Three times a day (TID) | ORAL | 0 refills | Status: DC
Start: 2020-08-21 — End: 2023-02-20

## 2020-08-21 MED ORDER — CEPHALEXIN 500 MG PO CAPS: 500.0000 mg | ORAL_CAPSULE | Freq: Four times a day (QID) | ORAL | 0 refills | Status: AC

## 2020-08-21 NOTE — ED Triage Notes (Signed)
Patient states her right middle finger began swelling x 2 days ago. Pt states the area is now warm to the touch and is throbbing. Pt is aox4 and ambulatory.

## 2020-08-21 NOTE — Discharge Instructions (Addendum)
Begin Keflex 4 times daily x5 days Warm soaks of finger, dry well and apply Bactroban twice daily Ibuprofen and Tylenol for pain Follow-up if not improving or worsening

## 2020-08-21 NOTE — ED Provider Notes (Signed)
[facility]    CSN: [id number] Arrival date & time: 08/21/20  1217      History   Chief Complaint Chief Complaint  Patient presents with  . Hand Pain    Right middle x 2 days    HPI Erica Barber is a 25 y.o. female presenting today for evaluation of finger infection.  Reports pain swelling and redness to her right middle finger beginning 2 to 3 days ago.  Patient denies any drainage.  Denies history of similar.  Denies injury, but does report biting her fingernails.  HPI  Past Medical History:  Diagnosis Date  . Asthma   . PPH (postpartum hemorrhage) 11/24/2019    Patient Active Problem List   Diagnosis Date Noted  . Anemia 04/11/2016  . Asthma 10/16/2015    Past Surgical History:  Procedure Laterality Date  . NO PAST SURGERIES      OB History    Gravida  3   Para  3   Term  3   Preterm      AB      Living  3     SAB      IAB      Ectopic      Multiple  0   Live Births  3            Home Medications    Prior to Admission medications   Medication Sig Start Date End Date Taking? Authorizing Provider  Blood Pressure Monitor KIT 1 Device by Does not apply route once a week. To be monitored Regularly at home. 07/15/19  Yes Woodroe Mode, MD  cephALEXin (KEFLEX) 500 MG capsule Take 1 capsule (500 mg total) by mouth 4 (four) times daily for 5 days. 08/21/20 08/26/20 Yes Jahira Swiss C, PA-C  etonogestrel-ethinyl estradiol (NUVARING) 0.12-0.015 MG/24HR vaginal ring Insert vaginally and leave in place for 3 consecutive weeks, then remove for 1 week. 12/26/19  Yes Gavin Pound, CNM  ibuprofen (ADVIL) 800 MG tablet Take 1 tablet (800 mg total) by mouth 3 (three) times daily. 08/21/20  Yes Braden Cimo C, PA-C  mupirocin ointment (BACTROBAN) 2 % Apply 1 application topically 2 (two) times daily. 08/21/20  Yes Opha Mcghee C, PA-C  albuterol (VENTOLIN HFA) 108 (90 Base) MCG/ACT inhaler Inhale 2 puffs into the lungs every 6 (six) hours  as needed for wheezing or shortness of breath. 11/26/19   Chauncey Mann, MD  ferrous sulfate 325 (65 FE) MG tablet Take 1 tablet (325 mg total) by mouth 2 (two) times daily with a meal. 12/01/19 08/21/20  Lajean Manes, CNM    Family History Family History  Problem Relation Age of Onset  . Hypertension Father   . Hypertension Paternal Grandmother     Social History Social History   Tobacco Use  . Smoking status: Never Smoker  . Smokeless tobacco: Never Used  Vaping Use  . Vaping Use: Never used  Substance Use Topics  . Alcohol use: No  . Drug use: Not Currently    Types: Marijuana    Comment: not since she has been pregnant     Allergies   Patient has no known allergies.   Review of Systems Review of Systems  Constitutional: Negative for fatigue and fever.  Eyes: Negative for visual disturbance.  Respiratory: Negative for shortness of breath.   Cardiovascular: Negative for chest pain.  Gastrointestinal: Negative for abdominal pain, nausea and vomiting.  Musculoskeletal: Positive for arthralgias. Negative for joint  swelling.  Skin: Positive for color change. Negative for rash and wound.  Neurological: Negative for dizziness, weakness, light-headedness and headaches.     Physical Exam Triage Vital Signs ED Triage Vitals  Enc Vitals Group     BP 08/21/20 1259 111/82     Pulse Rate 08/21/20 1259 82     Resp 08/21/20 1259 18     Temp 08/21/20 1259 98 F (36.7 C)     Temp Source 08/21/20 1259 Oral     SpO2 08/21/20 1259 97 %     Weight --      Height --      Head Circumference --      Peak Flow --      Pain Score 08/21/20 1301 5     Pain Loc --      Pain Edu? --      Excl. in [place]? --    No data found.  Updated Vital Signs BP 111/82 (BP Location: Left Arm)   Pulse 82   Temp 98 F (36.7 C) (Oral)   Resp 18   LMP  (LMP Unknown)   SpO2 97%   Visual Acuity Right Eye Distance:   Left Eye Distance:   Bilateral Distance:    Right Eye Near:   Left  Eye Near:    Bilateral Near:     Physical Exam Vitals and nursing note reviewed.  Constitutional:      Appearance: She is well-developed and well-nourished.     Comments: No acute distress  HENT:     Head: Normocephalic and atraumatic.     Nose: Nose normal.  Eyes:     Conjunctiva/sclera: Conjunctivae normal.  Cardiovascular:     Rate and Rhythm: Normal rate.  Pulmonary:     Effort: Pulmonary effort is normal. No respiratory distress.  Abdominal:     General: There is no distension.  Musculoskeletal:        General: Normal range of motion.     Cervical back: Neck supple.     Comments: Right middle finger with swelling erythema and pus pocket noted to lateral nail fold, no extension to distal finger pad  Skin:    General: Skin is warm and dry.  Neurological:     Mental Status: She is alert and oriented to person, place, and time.  Psychiatric:        Mood and Affect: Mood and affect normal.      UC Treatments / Results  Labs (all labs ordered are listed, but only abnormal results are displayed) Labs Reviewed - No data to display  EKG   Radiology No results found.  Procedures Incision and Drainage  Date/Time: 08/21/2020 1:36 PM Performed by: Avonda Toso, [place]al[place] C, PA-C Authorized by: Neely Kammerer, Elesa Hacker, PA-C   Consent:    Consent obtained:  Verbal   Consent given by:  Patient   Risks, benefits, and alternatives were discussed: yes     Risks discussed:  Pain and incomplete drainage   Alternatives discussed:  No treatment Universal protocol:    Patient identity confirmed:  Verbally with patient Location:    Type:  Abscess (Paronychia)   Location:  Upper extremity   Upper extremity location:  Finger   Finger location:  R long finger Pre-procedure details:    Skin preparation:  Povidone-iodine Anesthesia:    Anesthesia method:  None Procedure type:    Complexity:  Simple Procedure details:    Incision types:  Stab incision   Incision depth:  Dermal  Drainage:  Purulent   Drainage amount: mild.   Wound treatment:  Wound left open Post-procedure details:    Procedure completion:  Tolerated well, no immediate complications   (including critical care time)  Medications Ordered in UC Medications - No data to display  Initial Impression / Assessment and Plan / UC Course  I have reviewed the triage vital signs and the nursing notes.  Pertinent labs & imaging results that were available during my care of the patient were reviewed by me and considered in my medical decision making (see chart for details).     Paronychia-does not extend his distal finger pad, no sign of felon, placing on Keflex, recommending warm soaks and Bactroban topically, anti-inflammatories for pain.  Discussed strict return precautions. Patient verbalized understanding and is agreeable with plan.  Final Clinical Impressions(s) / UC Diagnoses   Final diagnoses:  Paronychia of right middle finger     Discharge Instructions     Begin Keflex 4 times daily x5 days Warm soaks of finger, dry well and apply Bactroban twice daily Ibuprofen and Tylenol for pain Follow-up if not improving or worsening   ED Prescriptions    Medication Sig Dispense Auth. Provider   cephALEXin (KEFLEX) 500 MG capsule Take 1 capsule (500 mg total) by mouth 4 (four) times daily for 5 days. 20 capsule Dantonio Justen C, PA-C   mupirocin ointment (BACTROBAN) 2 % Apply 1 application topically 2 (two) times daily. 30 g Brigham Cobbins C, PA-C   ibuprofen (ADVIL) 800 MG tablet Take 1 tablet (800 mg total) by mouth 3 (three) times daily. 30 tablet Sadi Arave, [place] C, PA-C     PDMP not reviewed this encounter.   Janith Lima, [place] 08/21/20 1337

## 2023-02-19 ENCOUNTER — Encounter (HOSPITAL_BASED_OUTPATIENT_CLINIC_OR_DEPARTMENT_OTHER): Payer: Self-pay | Admitting: Emergency Medicine

## 2023-02-19 ENCOUNTER — Emergency Department (HOSPITAL_BASED_OUTPATIENT_CLINIC_OR_DEPARTMENT_OTHER): Payer: Medicaid Other | Admitting: Radiology

## 2023-02-19 ENCOUNTER — Emergency Department (HOSPITAL_BASED_OUTPATIENT_CLINIC_OR_DEPARTMENT_OTHER): Payer: Medicaid Other

## 2023-02-19 ENCOUNTER — Other Ambulatory Visit: Payer: Self-pay

## 2023-02-19 ENCOUNTER — Emergency Department (HOSPITAL_BASED_OUTPATIENT_CLINIC_OR_DEPARTMENT_OTHER)
Admission: EM | Admit: 2023-02-19 | Discharge: 2023-02-20 | Disposition: A | Discharge status: Home / Self Care | Payer: Medicaid Other | Attending: Emergency Medicine | Admitting: Emergency Medicine

## 2023-02-19 DIAGNOSIS — M546 Pain in thoracic spine: Secondary | ICD-10-CM | POA: Diagnosis not present

## 2023-02-19 DIAGNOSIS — R110 Nausea: Secondary | ICD-10-CM | POA: Diagnosis not present

## 2023-02-19 DIAGNOSIS — M545 Low back pain, unspecified: Secondary | ICD-10-CM | POA: Diagnosis not present

## 2023-02-19 DIAGNOSIS — R519 Headache, unspecified: Secondary | ICD-10-CM | POA: Insufficient documentation

## 2023-02-19 DIAGNOSIS — J45909 Unspecified asthma, uncomplicated: Secondary | ICD-10-CM | POA: Insufficient documentation

## 2023-02-19 DIAGNOSIS — Y92410 Unspecified street and highway as the place of occurrence of the external cause: Secondary | ICD-10-CM | POA: Insufficient documentation

## 2023-02-19 DIAGNOSIS — S29019A Strain of muscle and tendon of unspecified wall of thorax, initial encounter: Secondary | ICD-10-CM

## 2023-02-19 MED ORDER — CYCLOBENZAPRINE HCL 10 MG PO TABS: 10.0000 mg | ORAL_TABLET | Freq: Three times a day (TID) | ORAL | 0 refills | Status: AC | PRN

## 2023-02-19 MED ORDER — CYCLOBENZAPRINE HCL 10 MG PO TABS
10.0000 mg | ORAL_TABLET | Freq: Once | ORAL | Status: AC
  Administered 2023-02-20: 10 mg via ORAL
  Filled 2023-02-19: qty 1

## 2023-02-19 MED ORDER — IBUPROFEN 400 MG PO TABS
400.0000 mg | ORAL_TABLET | Freq: Once | ORAL | Status: AC
  Administered 2023-02-20: 400 mg via ORAL
  Filled 2023-02-19: qty 1

## 2023-02-19 MED ORDER — ACETAMINOPHEN 325 MG PO TABS
650.0000 mg | ORAL_TABLET | Freq: Once | ORAL | Status: AC
  Administered 2023-02-20: 650 mg via ORAL
  Filled 2023-02-19: qty 2
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
## 2023-02-20 NOTE — Discharge Instructions (Addendum)
Apply ice to sore areas.  Ice to be applied for 30 minutes at a time, 4 times a day.  Take ibuprofen or naproxen as needed for pain.  For additional pain relief, add acetaminophen.  When you combine acetaminophen with either ibuprofen or naproxen, you get better pain relief than he gets from taking either medication by itself.  Take the muscle relaxer as needed.

## 2023-09-08 ENCOUNTER — Other Ambulatory Visit: Payer: Self-pay

## 2023-09-08 ENCOUNTER — Emergency Department (HOSPITAL_COMMUNITY)
Admission: EM | Admit: 2023-09-08 | Discharge: 2023-09-08 | Disposition: A | Discharge status: Home / Self Care | Payer: No Typology Code available for payment source | Attending: Emergency Medicine | Admitting: Emergency Medicine

## 2023-09-08 ENCOUNTER — Emergency Department (HOSPITAL_COMMUNITY): Payer: No Typology Code available for payment source

## 2023-09-08 DIAGNOSIS — S7012XA Contusion of left thigh, initial encounter: Secondary | ICD-10-CM | POA: Insufficient documentation

## 2023-09-08 DIAGNOSIS — Y92410 Unspecified street and highway as the place of occurrence of the external cause: Secondary | ICD-10-CM | POA: Diagnosis not present

## 2023-09-08 DIAGNOSIS — S40012A Contusion of left shoulder, initial encounter: Secondary | ICD-10-CM | POA: Insufficient documentation

## 2023-09-08 MED ORDER — ACETAMINOPHEN 500 MG PO TABS
1000.0000 mg | ORAL_TABLET | Freq: Once | ORAL | Status: AC
  Administered 2023-09-08: 1000 mg via ORAL
  Filled 2023-09-08: qty 2
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
## 2023-09-08 NOTE — Discharge Instructions (Signed)
Your x-ray of your shoulder did not show broken bone. Use Tylenol every 4 hours and Motrin every 6 as needed for pain use ice as needed.
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
